# Patient Record
Sex: Male | Born: 2000 | Race: Black or African American | Hispanic: No | Marital: Single | Smoking: Current every day smoker
Health system: Southern US, Community
[De-identification: ages and names within clinical notes are randomized; demographics above are authoritative.]

## PROBLEM LIST (undated history)

## (undated) DIAGNOSIS — L709 Acne, unspecified: Secondary | ICD-10-CM

## (undated) DIAGNOSIS — T7840XA Allergy, unspecified, initial encounter: Secondary | ICD-10-CM

## (undated) DIAGNOSIS — L309 Dermatitis, unspecified: Secondary | ICD-10-CM

## (undated) DIAGNOSIS — S8290XA Unspecified fracture of unspecified lower leg, initial encounter for closed fracture: Secondary | ICD-10-CM

## (undated) HISTORY — DX: Allergy, unspecified, initial encounter: T78.40XA

---
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
## 2019-01-13 ENCOUNTER — Encounter: Payer: Self-pay | Admitting: Emergency Medicine

## 2019-01-13 ENCOUNTER — Other Ambulatory Visit: Payer: Self-pay

## 2019-01-13 ENCOUNTER — Emergency Department
Admission: EM | Admit: 2019-01-13 | Discharge: 2019-01-13 | Disposition: A | Discharge status: Home / Self Care | Payer: Self-pay | Attending: Emergency Medicine | Admitting: Emergency Medicine

## 2019-01-13 DIAGNOSIS — J029 Acute pharyngitis, unspecified: Secondary | ICD-10-CM | POA: Insufficient documentation

## 2019-01-13 DIAGNOSIS — J020 Streptococcal pharyngitis: Secondary | ICD-10-CM

## 2019-01-13 DIAGNOSIS — Z91018 Allergy to other foods: Secondary | ICD-10-CM | POA: Insufficient documentation

## 2019-01-13 DIAGNOSIS — R509 Fever, unspecified: Secondary | ICD-10-CM | POA: Insufficient documentation

## 2019-01-13 MED ORDER — IBUPROFEN 100 MG/5ML PO SUSP
600.0000 mg | Freq: Once | ORAL | Status: AC
  Administered 2019-01-13: 600 mg via ORAL
  Filled 2019-01-13: qty 30

## 2019-01-13 MED ORDER — PENICILLIN G BENZATHINE 1200000 UNIT/2ML IM SUSP
1.2000 10*6.[IU] | Freq: Once | INTRAMUSCULAR | Status: AC
  Administered 2019-01-13: 08:00:00 1.2 10*6.[IU] via INTRAMUSCULAR
  Filled 2019-01-13: qty 2

## 2019-01-13 NOTE — ED Provider Notes (Signed)
[facility] Emergency Department Provider Note ____________________________________________  Time seen: 0745  I have reviewed the triage vital signs and the nursing notes.  HISTORY  Chief Complaint  Sore Throat  HPI Garrett Cook is a 18 y.o. male presents himself to the ED for 2-day complaint of sore throat.  Patient reports pain with swallowing, but has been able to control his secretions.  He was unaware of any intermittent fevers.  He did take a dose of DayQuil prior to arrival.   Past Medical History:  Diagnosis Date  . Acne   . Eczema   . Leg fracture     Patient Active Problem List   Diagnosis Date Noted  . Seasonal allergic rhinitis 11/02/2014  . History of concussion 11/02/2014  . 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 Acne 11/02/2014    History reviewed. No pertinent surgical history.  Prior to Admission medications   Medication Sig Start Date End Date Taking? Authorizing Provider  acetaminophen (TYLENOL) 325 MG tablet Take 650 mg by mouth every 6 (six) hours as needed. Reported on 10/04/2015    [provider]  clindamycin-benzoyl peroxide (BENZACLIN) gel Apply topically daily. In the morning 08/21/16   Rice, Kathlyn Sacramento, MD  diphenhydrAMINE (BENADRYL) 25 MG tablet Take 25 mg by mouth every 6 (six) hours as needed. Reported on 10/04/2015    [provider]  tretinoin (RETIN-A) 0.1 % cream Apply topically at bedtime. 08/21/16   Rice, Kathlyn Sacramento, MD    Allergies Banana and Pollen extract  Family History  Problem Relation Age of Onset  . Hypertension Father   . Cancer Other   . Hypertension Other     Social History Social History   Tobacco Use  . Smoking status: Never Smoker  . Smokeless tobacco: Never Used  Substance Use Topics  . Alcohol use: No  . Drug use: No    Review of Systems  Constitutional: Negative  for fever. Eyes: Negative for visual changes. ENT: Positive for sore throat. Cardiovascular: Negative for chest pain. Respiratory: Negative for shortness of breath. Gastrointestinal: Negative for abdominal pain, vomiting and diarrhea. Genitourinary: Negative for dysuria. Musculoskeletal: Negative for back pain. Skin: Negative for rash. Neurological: Negative for headaches, focal weakness or numbness. ____________________________________________  PHYSICAL EXAM:  VITAL SIGNS: ED Triage Vitals  Enc Vitals Group     BP 01/13/19 0739 127/78     Pulse Rate 01/13/19 0748 (!) 110     Resp 01/13/19 0739 20     Temp 01/13/19 0739 (!) 102 F (38.9 C)     Temp Source 01/13/19 0739 Oral     SpO2 --      Weight 01/13/19 0739 160 lb (72.6 kg)     Height 01/13/19 0739 5\' 8"  (1.727 m)     Head Circumference --      Peak Flow --      Pain Score 01/13/19 0739 7     Pain Loc --      Pain Edu? --      Excl. in [place]? --     Constitutional: Alert and oriented. Well appearing and in no distress. Head: Normocephalic and atraumatic. Eyes: Conjunctivae are normal. PERRL. Normal extraocular movements Mouth/Throat: Mucous membranes are moist.  Uvula is midline and tonsils are not enlarged, erythematous, and exudates noted bilaterally. Neck: Supple. No thyromegaly. Hematological/Lymphatic/Immunological: Palpable bilateral anterior cervical lymphadenopathy. Cardiovascular: Normal rate, regular  rhythm. Normal distal pulses. Respiratory: Normal respiratory effort. No wheezes/rales/rhonchi. Gastrointestinal: Soft and nontender. No distention. Musculoskeletal: Nontender with normal range of motion in all extremities.  Neurologic:  Normal gait without ataxia. Normal speech and language. No gross focal neurologic deficits are appreciated. ____________________________________________  PROCEDURES  Penicillin G benzathine 1.2 million units IM IBU Suspension 600 mg (30 ml) PO   Procedures ____________________________________________  INITIAL IMPRESSION / ASSESSMENT AND PLAN / ED COURSE  Patient with ED evaluation of a 2-day complaint of sore throat.  Patient presents febrile and tachycardic.  His clinical picture is consistent with an acute strep pharyngitis.  He will be treated empirically with pen G given single dose.  Patient is also given ibuprofen suspension for fever management.  He is advised to continue to monitor and treat fevers at home.  He will hydrate to prevent dehydration, and change toothbrush in 24 hours.  Patient is also given strict return precautions should he have sudden worsening of his pain, swelling, or inability to control oral secretions.  Garrett Cook was evaluated in Emergency Department on 01/13/2019 for the symptoms described in the history of present illness. He was evaluated in the context of the global COVID-19 pandemic, which necessitated consideration that the patient might be at risk for infection with the SARS-CoV-2 virus that causes COVID-19. Institutional protocols and algorithms that pertain to the evaluation of patients at risk for COVID-19 are in a state of rapid change based on information released by regulatory bodies including the CDC and federal and state organizations. These policies and algorithms were followed during the patient's care in the ED. ____________________________________________  FINAL CLINICAL IMPRESSION(S) / ED DIAGNOSES  Final diagnoses:  Strep throat      Hilmar Moldovan, Dannielle Karvonen, PA-C 01/13/19 9924    Duffy Bruce, MD 01/13/19 2012

## 2019-01-13 NOTE — ED Triage Notes (Signed)
Pt with fever in triage. Pt was not aware that he had a fever.

## 2019-01-13 NOTE — Discharge Instructions (Signed)
You have been treated for strep throat with a single dose of antibiotic in the ED. You should continue to treat any fevers with Tylenol and Motrin. Drink fluids to prevent dehydration. Change your toothbrush in 24-hours. Return to the ED for sudden swelling or inability to breathe or swallow your spit.

## 2019-01-13 NOTE — ED Triage Notes (Signed)
Pt reports sore throat and swollen tonsils for the last couple of days. Pt denies fevers or recent exposures.

## 2019-05-27 ENCOUNTER — Other Ambulatory Visit: Payer: Self-pay

## 2019-05-27 ENCOUNTER — Encounter: Payer: Self-pay | Admitting: Emergency Medicine

## 2019-05-27 ENCOUNTER — Emergency Department
Admission: EM | Admit: 2019-05-27 | Discharge: 2019-05-27 | Disposition: A | Discharge status: Home / Self Care | Payer: Medicaid Other | Attending: Emergency Medicine | Admitting: Emergency Medicine

## 2019-05-27 DIAGNOSIS — J039 Acute tonsillitis, unspecified: Secondary | ICD-10-CM | POA: Insufficient documentation

## 2019-05-27 LAB — GROUP A STREP BY PCR: Group A Strep by PCR: NOT DETECTED

## 2019-05-27 MED ORDER — AMOXICILLIN-POT CLAVULANATE 875-125 MG PO TABS
1.0000 | ORAL_TABLET | Freq: Once | ORAL | Status: AC
  Administered 2019-05-27: 1 via ORAL
  Filled 2019-05-27: qty 1

## 2019-05-27 MED ORDER — AMOXICILLIN-POT CLAVULANATE 875-125 MG PO TABS: 1.0000 | ORAL_TABLET | Freq: Two times a day (BID) | ORAL | 0 refills | Status: AC

## 2019-05-27 MED ORDER — LIDOCAINE VISCOUS HCL 2 % MT SOLN
15.0000 mL | Freq: Once | OROMUCOSAL | Status: AC
  Administered 2019-05-27: 15 mL via OROMUCOSAL
  Filled 2019-05-27: qty 15

## 2019-05-27 NOTE — ED Provider Notes (Signed)
[facility] Emergency Department Provider Note  ____________________________________________  Time seen: Approximately 11:13 AM  I have reviewed the triage vital signs and the nursing notes.   HISTORY  Chief Complaint Sore Throat    HPI Garrett Cook is a 19 y.o. male that presents to emergency department for evaluation of sore throat and body aches for 1 day.  Patient has a history of strep throat and states this feels the same.  He is sneezing a bit.  No known fevers. No contacts with Covid 19.  No cough, shortness of breath, vomiting, abdominal pain.   Past Medical History:  Diagnosis Date  . Acne   . Eczema   . Leg fracture     Patient Active Problem List   Diagnosis Date Noted  . Seasonal allergic rhinitis 11/02/2014  . History of concussion 11/02/2014  . 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 Acne 11/02/2014    History reviewed. No pertinent surgical history.  Prior to Admission medications   Medication Sig Start Date End Date Taking? Authorizing Provider  acetaminophen (TYLENOL) 325 MG tablet Take 650 mg by mouth every 6 (six) hours as needed. Reported on 10/04/2015    [provider]  amoxicillin-clavulanate (AUGMENTIN) 875-125 MG tablet Take 1 tablet by mouth 2 (two) times daily for 10 days. 05/27/19 06/06/19  Laban Emperor, PA-C  clindamycin-benzoyl peroxide (BENZACLIN) gel Apply topically daily. In the morning 08/21/16   Rice, Trenton Gammon, MD  diphenhydrAMINE (BENADRYL) 25 MG tablet Take 25 mg by mouth every 6 (six) hours as needed. Reported on 10/04/2015    [provider]  tretinoin (RETIN-A) 0.1 % cream Apply topically at bedtime. 08/21/16   Rice, Trenton Gammon, MD    Allergies Banana and Pollen extract  Family History  Problem Relation Age of Onset  . Hypertension Father   . Cancer Other   . Hypertension Other      Social History Social History   Tobacco Use  . Smoking status: Never Smoker  . Smokeless tobacco: Never Used  Substance Use Topics  . Alcohol use: No  . Drug use: No     Review of Systems  Constitutional: No fever/chills Eyes: No visual changes. No discharge. ENT: Negative for congestion. Positive for rhinorrhea. Positive for sore throat. Cardiovascular: No chest pain. Respiratory: Negative for cough. No SOB. Gastrointestinal: No abdominal pain.  No nausea, no vomiting.  No diarrhea.  No constipation. Musculoskeletal: Negative for musculoskeletal pain. Skin: Negative for rash, abrasions, lacerations, ecchymosis. Neurological: Negative for headaches.   ____________________________________________   PHYSICAL EXAM:  VITAL SIGNS: ED Triage Vitals  Enc Vitals Group     BP 05/27/19 1103 117/69     Pulse Rate 05/27/19 1103 85     Resp 05/27/19 1103 18     Temp 05/27/19 1103 99.3 F (37.4 C)     Temp Source 05/27/19 1103 Oral     SpO2 05/27/19 1103 99 %     Weight 05/27/19 1056 160 lb (72.6 kg)     Height 05/27/19 1056 5\' 8"  (1.727 m)     Head Circumference --      Peak Flow --      Pain Score 05/27/19 1056 8     Pain Loc --      Pain Edu? --      Excl. in [place]? --      Constitutional: Alert and oriented.  Well appearing and in no acute distress. Eyes: Conjunctivae are normal. PERRL. EOMI. No discharge. Head: Atraumatic. ENT: No frontal and maxillary sinus tenderness.      Ears: Tympanic membranes pearly gray with good landmarks. No discharge.      Nose: Mild rhinnorhea.      Mouth/Throat: Mucous membranes are moist. Oropharynx erythematous. Tonsils enlarged 1+bilaterally with exudates. Uvula midline. Neck: No stridor.   Hematological/Lymphatic/Immunilogical: No cervical lymphadenopathy. Cardiovascular: Normal rate, regular rhythm.  Good peripheral circulation. Respiratory: Normal respiratory effort without tachypnea or retractions. Lungs CTAB. Good air entry to  the bases with no decreased or absent breath sounds. Gastrointestinal: Bowel sounds 4 quadrants. Soft and nontender to palpation. No guarding or rigidity. No palpable masses. No distention. Musculoskeletal: Full range of motion to all extremities. No gross deformities appreciated. Neurologic:  Normal speech and language. No gross focal neurologic deficits are appreciated.  Skin:  Skin is warm, dry and intact. No rash noted. Psychiatric: Mood and affect are normal. Speech and behavior are normal. Patient exhibits appropriate insight and judgement.   ____________________________________________   LABS (all labs ordered are listed, but only abnormal results are displayed)  Labs Reviewed  GROUP A STREP BY PCR   ____________________________________________  EKG   ____________________________________________  RADIOLOGY  No results found.  ____________________________________________    PROCEDURES  Procedure(s) performed:    Procedures    Medications  amoxicillin-clavulanate (AUGMENTIN) 875-125 MG per tablet 1 tablet (1 tablet Oral Given 05/27/19 1132)  lidocaine (XYLOCAINE) 2 % viscous mouth solution 15 mL (15 mLs Mouth/Throat Given 05/27/19 1133)     ____________________________________________   INITIAL IMPRESSION / ASSESSMENT AND PLAN / ED COURSE  Pertinent labs & imaging results that were available during my care of the patient were reviewed by me and considered in my medical decision making (see chart for details).  Review of the [place] CSRS was performed in accordance of the [facility] prior to dispensing any controlled drugs.   Patient's diagnosis is consistent with tonsillitis. Vital signs and exam are reassuring. Strep is negative. Patient appears well and is staying well hydrated. Patient feels comfortable going home. Patient will be discharged home with prescriptions for Augmentin. Patient is to follow up with PCP as needed or otherwise directed. Patient is given ED  precautions to return to the ED for any worsening or new symptoms.   Garrett Cook was evaluated in Emergency Department on 05/27/2019 for the symptoms described in the history of present illness. He was evaluated in the context of the global COVID-19 pandemic, which necessitated consideration that the patient might be at risk for infection with the SARS-CoV-2 virus that causes COVID-19. Institutional protocols and algorithms that pertain to the evaluation of patients at risk for COVID-19 are in a state of rapid change based on information released by regulatory bodies including the CDC and federal and state organizations. These policies and algorithms were followed during the patient's care in the ED.  ____________________________________________  FINAL CLINICAL IMPRESSION(S) / ED DIAGNOSES  Final diagnoses:  Tonsillitis      NEW MEDICATIONS STARTED DURING THIS VISIT:  ED Discharge Orders         Ordered    amoxicillin-clavulanate (AUGMENTIN) 875-125 MG tablet  2 times daily     05/27/19 1126              This chart was dictated using voice recognition software/Dragon. Despite best efforts to proofread, errors can occur which can change the meaning. Any change was purely unintentional.  Enid Derry, PA-C 05/27/19 1535    Minna Antis, MD 05/28/19 1317

## 2019-05-27 NOTE — ED Triage Notes (Signed)
Patient presents to the ED for sore throat since yesterday.  Patient states he noticed white patches on his throat.  Patient is in no obvious distress at this time.

## 2022-01-29 ENCOUNTER — Encounter (HOSPITAL_COMMUNITY): Payer: Self-pay

## 2022-01-29 ENCOUNTER — Other Ambulatory Visit: Payer: Self-pay

## 2022-01-29 ENCOUNTER — Inpatient Hospital Stay (HOSPITAL_COMMUNITY)
Admission: EM | Admit: 2022-01-29 | Discharge: 2022-01-31 | DRG: 391 | Disposition: A | Discharge status: Home / Self Care | Payer: Self-pay | Attending: Internal Medicine | Admitting: Internal Medicine

## 2022-01-29 DIAGNOSIS — R6511 Systemic inflammatory response syndrome (SIRS) of non-infectious origin with acute organ dysfunction: Secondary | ICD-10-CM | POA: Diagnosis present

## 2022-01-29 DIAGNOSIS — K529 Noninfective gastroenteritis and colitis, unspecified: Secondary | ICD-10-CM | POA: Diagnosis present

## 2022-01-29 DIAGNOSIS — J302 Other seasonal allergic rhinitis: Secondary | ICD-10-CM | POA: Diagnosis present

## 2022-01-29 DIAGNOSIS — I959 Hypotension, unspecified: Secondary | ICD-10-CM | POA: Diagnosis present

## 2022-01-29 DIAGNOSIS — A419 Sepsis, unspecified organism: Principal | ICD-10-CM

## 2022-01-29 DIAGNOSIS — N179 Acute kidney failure, unspecified: Secondary | ICD-10-CM | POA: Diagnosis present

## 2022-01-29 DIAGNOSIS — Z20822 Contact with and (suspected) exposure to covid-19: Secondary | ICD-10-CM | POA: Diagnosis present

## 2022-01-29 DIAGNOSIS — Z8719 Personal history of other diseases of the digestive system: Secondary | ICD-10-CM | POA: Diagnosis present

## 2022-01-29 DIAGNOSIS — R112 Nausea with vomiting, unspecified: Secondary | ICD-10-CM

## 2022-01-29 DIAGNOSIS — A059 Bacterial foodborne intoxication, unspecified: Principal | ICD-10-CM | POA: Diagnosis present

## 2022-01-29 DIAGNOSIS — Z91018 Allergy to other foods: Secondary | ICD-10-CM

## 2022-01-29 DIAGNOSIS — E861 Hypovolemia: Secondary | ICD-10-CM | POA: Diagnosis present

## 2022-01-29 MED ORDER — ONDANSETRON HCL 4 MG/2ML IJ SOLN
4.0000 mg | Freq: Once | INTRAMUSCULAR | Status: AC
  Administered 2022-01-29: 4 mg via INTRAVENOUS
  Filled 2022-01-29: qty 2

## 2022-01-29 MED ORDER — SODIUM CHLORIDE 0.9 % IV BOLUS
1000.0000 mL | Freq: Once | INTRAVENOUS | Status: AC
  Administered 2022-01-29: 1000 mL via INTRAVENOUS

## 2022-01-29 NOTE — ED Provider Triage Note (Signed)
Emergency Medicine Provider Triage Evaluation Note  Garrett Cook , a 21 y.o. male  was evaluated in triage.  Pt complains of N/V/D that started a few hours after having [place] chicken dip from [employer] 2 days ago. Patient notes lower abdominal pain/cramping, subjective fever, chills, and generalized weakness. No prior abdominal surgeries.  Review of Systems  Positive: As above Negative: As above  Physical Exam  BP (!) 86/58 (BP Location: Right Arm)   Pulse (!) 152   Temp 99.9 F (37.7 C) (Oral)   Resp 18   Ht 5\' 8"  (1.727 m)   Wt 68 kg   SpO2 96%   BMI 22.81 kg/m  Gen:   Awake, ill appearing, no distress   Resp:  Normal effort  MSK:   Moves extremities without difficulty  Other:  No focal abdominal TTP. Heart rate tachycardic in the 140-150's.  Medical Decision Making  Medically screening exam initiated at 11:15 PM.  Appropriate orders placed.  Garrett Cook was informed that the remainder of the evaluation will be completed by another provider, this initial triage assessment does not replace that evaluation, and the importance of remaining in the ED until their evaluation is complete.  N/V/D x 2 days - hypotensive and tachycardia likely 2/2 hypovolemia. Labs ordered as well as blood cultures, lactate, abdominopelvic CT. Charge RN alerted of need for expedited room placement given hypotension.   [name], PA-C 01/29/22 2321

## 2022-01-29 NOTE — ED Triage Notes (Signed)
Pt c/o nausea, vomiting, diarrhea, chills and weakness x 2 days.  Pt has had fevers.

## 2022-01-30 ENCOUNTER — Encounter (HOSPITAL_COMMUNITY): Payer: Self-pay | Admitting: Internal Medicine

## 2022-01-30 ENCOUNTER — Observation Stay (HOSPITAL_COMMUNITY): Payer: Self-pay

## 2022-01-30 ENCOUNTER — Emergency Department (HOSPITAL_COMMUNITY): Payer: Self-pay

## 2022-01-30 DIAGNOSIS — Z8719 Personal history of other diseases of the digestive system: Secondary | ICD-10-CM | POA: Diagnosis present

## 2022-01-30 DIAGNOSIS — R6511 Systemic inflammatory response syndrome (SIRS) of non-infectious origin with acute organ dysfunction: Secondary | ICD-10-CM | POA: Diagnosis present

## 2022-01-30 DIAGNOSIS — K529 Noninfective gastroenteritis and colitis, unspecified: Secondary | ICD-10-CM | POA: Diagnosis present

## 2022-01-30 DIAGNOSIS — A059 Bacterial foodborne intoxication, unspecified: Secondary | ICD-10-CM | POA: Diagnosis present

## 2022-01-30 DIAGNOSIS — N179 Acute kidney failure, unspecified: Secondary | ICD-10-CM | POA: Diagnosis present

## 2022-01-30 LAB — URINALYSIS, ROUTINE W REFLEX MICROSCOPIC
Bacteria, UA: NONE SEEN
Bilirubin Urine: NEGATIVE
Glucose, UA: NEGATIVE mg/dL
Ketones, ur: NEGATIVE mg/dL
Leukocytes,Ua: NEGATIVE
Nitrite: NEGATIVE
Protein, ur: NEGATIVE mg/dL
Specific Gravity, Urine: 1.005 (ref 1.005–1.030)
pH: 6 (ref 5.0–8.0)

## 2022-01-30 LAB — COMPREHENSIVE METABOLIC PANEL
ALT: 23 U/L (ref 0–44)
ALT: 26 U/L (ref 0–44)
ALT: 37 U/L (ref 0–44)
ALT: 8 U/L (ref 0–44)
AST: 11 U/L — ABNORMAL LOW (ref 15–41)
AST: 31 U/L (ref 15–41)
AST: 32 U/L (ref 15–41)
AST: 41 U/L (ref 15–41)
Albumin: 2.9 g/dL — ABNORMAL LOW (ref 3.5–5.0)
Albumin: 2.9 g/dL — ABNORMAL LOW (ref 3.5–5.0)
Albumin: 4.2 g/dL (ref 3.5–5.0)
Albumin: <1.5 g/dL — ABNORMAL LOW (ref 3.5–5.0)
Alkaline Phosphatase: 34 U/L — ABNORMAL LOW (ref 38–126)
Alkaline Phosphatase: 36 U/L — ABNORMAL LOW (ref 38–126)
Alkaline Phosphatase: 50 U/L (ref 38–126)
Alkaline Phosphatase: 9 U/L — ABNORMAL LOW (ref 38–126)
Anion gap: 13 (ref 5–15)
Anion gap: 13 (ref 5–15)
Anion gap: 15 (ref 5–15)
Anion gap: 8 (ref 5–15)
BUN: 17 mg/dL (ref 6–20)
BUN: 23 mg/dL — ABNORMAL HIGH (ref 6–20)
BUN: 23 mg/dL — ABNORMAL HIGH (ref 6–20)
BUN: 9 mg/dL (ref 6–20)
CO2: 10 mmol/L — ABNORMAL LOW (ref 22–32)
CO2: 24 mmol/L (ref 22–32)
CO2: 25 mmol/L (ref 22–32)
CO2: 25 mmol/L (ref 22–32)
Calcium: 6.4 mg/dL — CL (ref 8.9–10.3)
Calcium: 8.1 mg/dL — ABNORMAL LOW (ref 8.9–10.3)
Calcium: 8.3 mg/dL — ABNORMAL LOW (ref 8.9–10.3)
Calcium: 9.3 mg/dL (ref 8.9–10.3)
Chloride: 104 mmol/L (ref 98–111)
Chloride: 106 mmol/L (ref 98–111)
Chloride: 95 mmol/L — ABNORMAL LOW (ref 98–111)
Chloride: 99 mmol/L (ref 98–111)
Creatinine, Ser: 0.73 mg/dL (ref 0.61–1.24)
Creatinine, Ser: 2.11 mg/dL — ABNORMAL HIGH (ref 0.61–1.24)
Creatinine, Ser: 2.27 mg/dL — ABNORMAL HIGH (ref 0.61–1.24)
Creatinine, Ser: 2.57 mg/dL — ABNORMAL HIGH (ref 0.61–1.24)
GFR, Estimated: 35 mL/min — ABNORMAL LOW (ref >60)
GFR, Estimated: 41 mL/min — ABNORMAL LOW (ref >60)
GFR, Estimated: 44 mL/min — ABNORMAL LOW (ref >60)
GFR, Estimated: >60 mL/min (ref >60)
Glucose, Bld: 113 mg/dL — ABNORMAL HIGH (ref 70–99)
Glucose, Bld: 136 mg/dL — ABNORMAL HIGH (ref 70–99)
Glucose, Bld: 150 mg/dL — ABNORMAL HIGH (ref 70–99)
Glucose, Bld: 57 mg/dL — ABNORMAL LOW (ref 70–99)
Potassium: 3.8 mmol/L (ref 3.5–5.1)
Potassium: 3.9 mmol/L (ref 3.5–5.1)
Potassium: 4 mmol/L (ref 3.5–5.1)
Potassium: 4.1 mmol/L (ref 3.5–5.1)
Sodium: 131 mmol/L — ABNORMAL LOW (ref 135–145)
Sodium: 133 mmol/L — ABNORMAL LOW (ref 135–145)
Sodium: 136 mmol/L (ref 135–145)
Sodium: 137 mmol/L (ref 135–145)
Total Bilirubin: 0.4 mg/dL (ref 0.3–1.2)
Total Bilirubin: 0.5 mg/dL (ref 0.3–1.2)
Total Bilirubin: 0.8 mg/dL (ref 0.3–1.2)
Total Bilirubin: 1.3 mg/dL — ABNORMAL HIGH (ref 0.3–1.2)
Total Protein: 5.4 g/dL — ABNORMAL LOW (ref 6.5–8.1)
Total Protein: 5.5 g/dL — ABNORMAL LOW (ref 6.5–8.1)
Total Protein: 8.1 g/dL (ref 6.5–8.1)
Total Protein: <3 g/dL — ABNORMAL LOW (ref 6.5–8.1)

## 2022-01-30 LAB — CBC WITH DIFFERENTIAL/PLATELET
Abs Immature Granulocytes: 0.12 K/uL — ABNORMAL HIGH (ref 0.00–0.07)
Abs Immature Granulocytes: 0.07 10*3/uL (ref 0.00–0.07)
Basophils Absolute: 0 K/uL (ref 0.0–0.1)
Basophils Absolute: 0 10*3/uL (ref 0.0–0.1)
Basophils Relative: 0 %
Basophils Relative: 0 %
Eosinophils Absolute: 0 K/uL (ref 0.0–0.5)
Eosinophils Absolute: 0 10*3/uL (ref 0.0–0.5)
Eosinophils Relative: 0 %
Eosinophils Relative: 0 %
HCT: 46.8 % (ref 39.0–52.0)
HCT: 34.6 % — ABNORMAL LOW (ref 39.0–52.0)
Hemoglobin: 15.9 g/dL (ref 13.0–17.0)
Hemoglobin: 11.6 g/dL — ABNORMAL LOW (ref 13.0–17.0)
Immature Granulocytes: 1 %
Immature Granulocytes: 1 %
Lymphocytes Relative: 11 %
Lymphocytes Relative: 13 %
Lymphs Abs: 1 K/uL (ref 0.7–4.0)
Lymphs Abs: 1 10*3/uL (ref 0.7–4.0)
MCH: 28.6 pg (ref 26.0–34.0)
MCH: 28.6 pg (ref 26.0–34.0)
MCHC: 34 g/dL (ref 30.0–36.0)
MCHC: 33.5 g/dL (ref 30.0–36.0)
MCV: 84.2 fL (ref 80.0–100.0)
MCV: 85.2 fL (ref 80.0–100.0)
Monocytes Absolute: 0.4 K/uL (ref 0.1–1.0)
Monocytes Absolute: 0.5 10*3/uL (ref 0.1–1.0)
Monocytes Relative: 4 %
Monocytes Relative: 6 %
Neutro Abs: 7.9 K/uL — ABNORMAL HIGH (ref 1.7–7.7)
Neutro Abs: 6.4 10*3/uL (ref 1.7–7.7)
Neutrophils Relative %: 84 %
Neutrophils Relative %: 80 %
Platelets: 272 K/uL (ref 150–400)
Platelets: 202 10*3/uL (ref 150–400)
RBC: 5.56 MIL/uL (ref 4.22–5.81)
RBC: 4.06 MIL/uL — ABNORMAL LOW (ref 4.22–5.81)
RDW: 12.9 % (ref 11.5–15.5)
RDW: 12.8 % (ref 11.5–15.5)
WBC: 9.4 K/uL (ref 4.0–10.5)
WBC: 8 10*3/uL (ref 4.0–10.5)
nRBC: 0 % (ref 0.0–0.2)
nRBC: 0 % (ref 0.0–0.2)

## 2022-01-30 LAB — PROTIME-INR
INR: 1.1 (ref 0.8–1.2)
Prothrombin Time: 14.5 seconds (ref 11.4–15.2)

## 2022-01-30 LAB — MAGNESIUM: Magnesium: 1.5 mg/dL — ABNORMAL LOW (ref 1.7–2.4)

## 2022-01-30 LAB — RESP PANEL BY RT-PCR (FLU A&B, COVID) ARPGX2
Influenza A by PCR: NEGATIVE
Influenza B by PCR: NEGATIVE
SARS Coronavirus 2 by RT PCR: NEGATIVE

## 2022-01-30 LAB — LACTIC ACID, PLASMA
Lactic Acid, Venous: 1.6 mmol/L (ref 0.5–1.9)
Lactic Acid, Venous: 2.6 mmol/L (ref 0.5–1.9)

## 2022-01-30 LAB — CBG MONITORING, ED: Glucose-Capillary: 132 mg/dL — ABNORMAL HIGH (ref 70–99)

## 2022-01-30 LAB — APTT: aPTT: 33 seconds (ref 24–36)

## 2022-01-30 LAB — CK: Total CK: 134 U/L (ref 49–397)

## 2022-01-30 LAB — LIPASE, BLOOD: Lipase: 32 U/L (ref 11–51)

## 2022-01-30 MED ORDER — HEPARIN SODIUM (PORCINE) 5000 UNIT/ML IJ SOLN
5000.0000 [IU] | Freq: Three times a day (TID) | INTRAMUSCULAR | Status: DC
  Filled 2022-01-30 (×3): qty 1

## 2022-01-30 MED ORDER — LACTATED RINGERS IV BOLUS
1000.0000 mL | INTRAVENOUS | Status: AC
  Administered 2022-01-30 (×2): 1000 mL via INTRAVENOUS

## 2022-01-30 MED ORDER — LACTATED RINGERS IV BOLUS (SEPSIS)
1000.0000 mL | Freq: Once | INTRAVENOUS | Status: AC
  Administered 2022-01-30: 1000 mL via INTRAVENOUS

## 2022-01-30 MED ORDER — LACTATED RINGERS IV BOLUS (SEPSIS)
250.0000 mL | Freq: Once | INTRAVENOUS | Status: AC
  Administered 2022-01-30: 250 mL via INTRAVENOUS

## 2022-01-30 MED ORDER — LOPERAMIDE HCL 2 MG PO CAPS: 2.0000 mg | ORAL_CAPSULE | Freq: Three times a day (TID) | ORAL | Status: DC | PRN

## 2022-01-30 MED ORDER — LACTATED RINGERS IV SOLN: INTRAVENOUS | Status: AC

## 2022-01-30 MED ORDER — PROCHLORPERAZINE EDISYLATE 10 MG/2ML IJ SOLN
10.0000 mg | INTRAMUSCULAR | Status: DC | PRN
  Administered 2022-01-30: 10 mg via INTRAVENOUS
  Filled 2022-01-30: qty 2

## 2022-01-30 MED ORDER — ACETAMINOPHEN 325 MG PO TABS: 650.0000 mg | ORAL_TABLET | Freq: Four times a day (QID) | ORAL | Status: DC | PRN

## 2022-01-30 MED ORDER — ACETAMINOPHEN 650 MG RE SUPP: 650.0000 mg | Freq: Four times a day (QID) | RECTAL | Status: DC | PRN

## 2022-01-30 MED ORDER — DICYCLOMINE HCL 10 MG/ML IM SOLN
20.0000 mg | Freq: Once | INTRAMUSCULAR | Status: AC
  Administered 2022-01-30: 20 mg via INTRAMUSCULAR
  Filled 2022-01-30: qty 2

## 2022-01-30 MED ORDER — SODIUM CHLORIDE 0.9 % IV SOLN
2.0000 g | Freq: Once | INTRAVENOUS | Status: AC
  Administered 2022-01-30: 2 g via INTRAVENOUS
  Filled 2022-01-30: qty 12.5

## 2022-01-30 MED ORDER — LACTATED RINGERS IV SOLN: INTRAVENOUS | Status: DC

## 2022-01-30 MED ORDER — DROPERIDOL 2.5 MG/ML IJ SOLN
1.2500 mg | Freq: Once | INTRAMUSCULAR | Status: AC
  Administered 2022-01-30: 1.25 mg via INTRAVENOUS
  Filled 2022-01-30: qty 2

## 2022-01-30 MED ORDER — MAGNESIUM SULFATE 4 GM/100ML IV SOLN
4.0000 g | Freq: Once | INTRAVENOUS | Status: AC
  Administered 2022-01-30: 4 g via INTRAVENOUS
  Filled 2022-01-30 (×2): qty 100

## 2022-01-30 MED ORDER — VANCOMYCIN HCL IN DEXTROSE 1-5 GM/200ML-% IV SOLN
1000.0000 mg | Freq: Once | INTRAVENOUS | Status: AC
  Administered 2022-01-30: 1000 mg via INTRAVENOUS
  Filled 2022-01-30: qty 200

## 2022-01-30 MED ORDER — METRONIDAZOLE 500 MG/100ML IV SOLN
500.0000 mg | Freq: Once | INTRAVENOUS | Status: AC
  Administered 2022-01-30: 500 mg via INTRAVENOUS
  Filled 2022-01-30: qty 100

## 2022-01-30 NOTE — ED Notes (Signed)
Admitting provider at bedside.

## 2022-01-30 NOTE — ED Notes (Signed)
Patient transported to CT 

## 2022-01-30 NOTE — ED Provider Notes (Signed)
[facility] EMERGENCY DEPARTMENT Provider Note   CSN: [id number] Arrival date & time: 01/29/22  2107     History  Chief Complaint  Patient presents with   Emesis    Garrett Cook is a 21 y.o. male.  The history is provided by the patient.  Emesis Severity:  Severe Duration:  3 days Timing:  Intermittent Quality:  Stomach contents Progression:  Unchanged Chronicity:  New Context: not post-tussive   Relieved by:  Nothing Worsened by:  Nothing Ineffective treatments:  None tried Associated symptoms: diarrhea and fever   Risk factors: suspect food intake        Home Medications Prior to Admission medications   Not on File      Allergies    Banana and Pollen extract    Review of Systems   Review of Systems  Constitutional:  Positive for fever.  HENT:  Negative for facial swelling.   Respiratory:  Negative for wheezing and stridor.   Gastrointestinal:  Positive for diarrhea, nausea and vomiting.  All other systems reviewed and are negative. Ate [employer] [place] Dip   Physical Exam Updated Vital Signs BP (!) 105/59   Pulse (!) 108   Temp 99.9 F (37.7 C) (Oral)   Resp 17   Ht 5\' 8"  (1.727 m)   Wt 68 kg   SpO2 98%   BMI 22.81 kg/m  Physical Exam Constitutional:      General: He is not in acute distress.    Appearance: Normal appearance. He is well-developed. He is not diaphoretic.  HENT:     Head: Normocephalic and atraumatic.     Nose: Nose normal.  Eyes:     Conjunctiva/sclera: Conjunctivae normal.     Pupils: Pupils are equal, round, and reactive to light.  Cardiovascular:     Rate and Rhythm: Regular rhythm. Tachycardia present.  Pulmonary:     Effort: Pulmonary effort is normal.     Breath sounds: Normal breath sounds. No wheezing or rales.  Abdominal:     General: Bowel sounds are normal.     Palpations: Abdomen is soft.     Tenderness: There is no abdominal tenderness. There is no guarding or rebound.  Musculoskeletal:         General: Normal range of motion.     Cervical back: Normal range of motion and neck supple.  Skin:    General: Skin is warm and dry.     Capillary Refill: Capillary refill takes less than 2 seconds.  Neurological:     General: No focal deficit present.     Mental Status: He is alert and oriented to person, place, and time.  Psychiatric:        Mood and Affect: Mood normal.     ED Results / Procedures / Treatments   Labs (all labs ordered are listed, but only abnormal results are displayed) Results for orders placed or performed during the hospital encounter of 01/29/22  Resp Panel by RT-PCR (Flu A&B, Covid) Anterior Nasal Swab   Specimen: Anterior Nasal Swab  Result Value Ref Range   SARS Coronavirus 2 by RT PCR NEGATIVE NEGATIVE   Influenza A by PCR NEGATIVE NEGATIVE   Influenza B by PCR NEGATIVE NEGATIVE  CBC with Differential  Result Value Ref Range   WBC 9.4 4.0 - 10.5 K/uL   RBC 5.56 4.22 - 5.81 MIL/uL   Hemoglobin 15.9 13.0 - 17.0 g/dL   HCT 01/31/22 22.9 - 79.8 %   MCV 84.2  80.0 - 100.0 fL   MCH 28.6 26.0 - 34.0 pg   MCHC 34.0 30.0 - 36.0 g/dL   RDW 62.2 29.7 - 98.9 %   Platelets 272 150 - 400 K/uL   nRBC 0.0 0.0 - 0.2 %   Neutrophils Relative % 84 %   Neutro Abs 7.9 (H) 1.7 - 7.7 K/uL   Lymphocytes Relative 11 %   Lymphs Abs 1.0 0.7 - 4.0 K/uL   Monocytes Relative 4 %   Monocytes Absolute 0.4 0.1 - 1.0 K/uL   Eosinophils Relative 0 %   Eosinophils Absolute 0.0 0.0 - 0.5 K/uL   Basophils Relative 0 %   Basophils Absolute 0.0 0.0 - 0.1 K/uL   Immature Granulocytes 1 %   Abs Immature Granulocytes 0.12 (H) 0.00 - 0.07 K/uL  Comprehensive metabolic panel  Result Value Ref Range   Sodium 133 (L) 135 - 145 mmol/L   Potassium 3.8 3.5 - 5.1 mmol/L   Chloride 95 (L) 98 - 111 mmol/L   CO2 25 22 - 32 mmol/L   Glucose, Bld 150 (H) 70 - 99 mg/dL   BUN 23 (H) 6 - 20 mg/dL   Creatinine, Ser 2.11 (H) 0.61 - 1.24 mg/dL   Calcium 9.3 8.9 - 94.1 mg/dL   Total Protein  8.1 6.5 - 8.1 g/dL   Albumin 4.2 3.5 - 5.0 g/dL   AST 41 15 - 41 U/L   ALT 37 0 - 44 U/L   Alkaline Phosphatase 50 38 - 126 U/L   Total Bilirubin 1.3 (H) 0.3 - 1.2 mg/dL   GFR, Estimated 35 (L) >60 mL/min   Anion gap 13 5 - 15  Lipase, blood  Result Value Ref Range   Lipase 32 11 - 51 U/L  Lactic acid, plasma  Result Value Ref Range   Lactic Acid, Venous 2.6 (HH) 0.5 - 1.9 mmol/L  Lactic acid, plasma  Result Value Ref Range   Lactic Acid, Venous 1.6 0.5 - 1.9 mmol/L  Protime-INR  Result Value Ref Range   Prothrombin Time 14.5 11.4 - 15.2 seconds   INR 1.1 0.8 - 1.2  APTT  Result Value Ref Range   aPTT 33 24 - 36 seconds   DG Chest Port 1 View  Result Date: 01/30/2022 CLINICAL DATA:  Nausea/vomiting/diarrhea, sepsis EXAM: PORTABLE CHEST 1 VIEW COMPARISON:  None Available. FINDINGS: Lungs are clear.  No pleural effusion or pneumothorax. The heart is normal in size. IMPRESSION: No evidence of acute cardiopulmonary disease. Electronically Signed   By: Charline Bills M.D.   On: 01/30/2022 01:05    EKG  EKG Interpretation  Date/Time:  Tuesday January 30 2022 01:21:03 EST Ventricular Rate:  108 PR Interval:  128 QRS Duration: 90 QT Interval:  281 QTC Calculation: 377 R Axis:   56 Text Interpretation: Sinus tachycardia RSR' in V1 or V2, right VCD or RVH Confirmed by Nicanor Alcon, Brindy Higginbotham ([id number]) on 01/30/2022 2:40:01 AM         Radiology DG Chest Port 1 View  Result Date: 01/30/2022 CLINICAL DATA:  Nausea/vomiting/diarrhea, sepsis EXAM: PORTABLE CHEST 1 VIEW COMPARISON:  None Available. FINDINGS: Lungs are clear.  No pleural effusion or pneumothorax. The heart is normal in size. IMPRESSION: No evidence of acute cardiopulmonary disease. Electronically Signed   By: Charline Bills M.D.   On: 01/30/2022 01:05    Procedures Procedures    Medications Ordered in ED Medications  lactated ringers infusion ( Intravenous New Bag/Given 01/30/22 0131)  sodium chloride 0.9 %  bolus 1,000 mL (1,000 mLs Intravenous New Bag/Given 01/29/22 2347)  ondansetron (ZOFRAN) injection 4 mg (4 mg Intravenous Given 01/29/22 2347)  lactated ringers bolus 1,000 mL (1,000 mLs Intravenous New Bag/Given 01/30/22 0130)    And  lactated ringers bolus 1,000 mL (1,000 mLs Intravenous New Bag/Given 01/30/22 0130)    And  lactated ringers bolus 250 mL (250 mLs Intravenous New Bag/Given 01/30/22 0223)  ceFEPIme (MAXIPIME) 2 g in sodium chloride 0.9 % 100 mL IVPB (2 g Intravenous New Bag/Given 01/30/22 0119)  metroNIDAZOLE (FLAGYL) IVPB 500 mg (500 mg Intravenous New Bag/Given 01/30/22 0125)  vancomycin (VANCOCIN) IVPB 1000 mg/200 mL premix (1,000 mg Intravenous New Bag/Given 01/30/22 0132)  dicyclomine (BENTYL) injection 20 mg (20 mg Intramuscular Given 01/30/22 0223)    ED Course/ Medical Decision Making/ A&P                           Medical Decision Making Patient ate something and then 3 hours later began having   Problems Addressed: AKI (acute kidney injury) [facility]):    Details: IV fluids and admission  Nausea vomiting and diarrhea:    Details: Droperidol and IV fluids   Amount and/or Complexity of Data Reviewed External Data Reviewed: notes.    Details: Previous notes reviewed  Labs: ordered.    Details: All labs reviewed:  normal lactate 1.6.  Normal white count 9.4, hemoglobin 15.9, normal platelets. Negative covid and flu.  Low sodium 133, normal potassium 3.8, elevated BUN 23, elevated creatinine 2.57, normal AST and ALT mild bilirubin 1.3.  Normal coagulation studies   Radiology: ordered and independent interpretation performed.    Details: Normal CXR by me  ECG/medicine tests: ordered and independent interpretation performed. Decision-making details documented in ED Course. Discussion of management or test interpretation with external provider(s): Dr. Imogene Burn triad   Risk Prescription drug management. Decision regarding hospitalization.  Critical Care Total time  providing critical care: 60 minutes (Sepsis bundle )   CRITICAL CARE Performed by: Lymon Kidney K Aime Meloche-Rasch Total critical care time: 60 minutes Critical care time was exclusive of separately billable procedures and treating other patients. Critical care was necessary to treat or prevent imminent or life-threatening deterioration. Critical care was time spent personally by me on the following activities: development of treatment plan with patient and/or surrogate as well as nursing, discussions with consultants, evaluation of patient's response to treatment, examination of patient, obtaining history from patient or surrogate, ordering and performing treatments and interventions, ordering and review of laboratory studies, ordering and review of radiographic studies, pulse oximetry and re-evaluation of patient's condition.  Final Clinical Impression(s) / ED Diagnoses Final diagnoses:  Sepsis, due to unspecified organism, unspecified whether acute organ dysfunction present [facility])  AKI (acute kidney injury) (HCC)  Nausea vomiting and diarrhea    Rx / DC Orders ED Discharge Orders     None         Daeshon Grammatico, MD 01/30/22 5638

## 2022-01-30 NOTE — Assessment & Plan Note (Addendum)
Continue with IVF. Likely due to N/V/D. Do not give immodium or lomotil or other anti-diarrheal meds.

## 2022-01-30 NOTE — ED Notes (Signed)
Patient calls out for RN for ongoing emesis. Patient ambulated to rr, reports having a diarrhea episode as well and tx more emesis. Emesis noted to be color green. Patient denies pain, PRN n/v administered.

## 2022-01-30 NOTE — H&P (Signed)
History and Physical    Garrett Cook MWU:[id number] DOB: 2001-01-22 DOA: 01/29/2022  DOS: the patient was seen and examined on 01/29/2022  PCP: Patient, No Pcp Per   Patient coming from: Home  I have personally briefly reviewed patient's old medical records in  Link  CC: N/V/D HPI: 21 year old African-American male with no significant medical history presents to the ER today with a 3-day history of nausea vomiting diarrhea.  He states that he had some leftover buffalo chicken dip from the restaurant [employer] on Saturday evening.  This was around 10 PM.  By midnight, he was having vomiting and diarrhea.  This continued over the last 3 days.  He has been having vomiting diarrhea multiple times a day.  He is unable to keep liquids down.  He took some Pepto-Bismol yesterday which only made his nausea vomiting worse.  Came to the ER for evaluation.  On arrival, temp 98.9 heart rate 152 blood pressure 86/58 satting 96% on room air.  White count 9.4, hemoglobin 15.9, platelets of 272  Initial lactic acid 2.6  BUN of 23, creatinine 2.5  After IV fluids, repeat lactic acid 1.6  [facility] hospitalist contacted for admission.  Blood cultures were obtained.  Patient given a dose of IV cefepime, Flagyl and vancomycin.   ED Course: hypotensive on arrival. Scr 2.5  Review of Systems:  Review of Systems  Constitutional: Negative.   HENT: Negative.    Eyes: Negative.   Respiratory: Negative.    Cardiovascular: Negative.   Gastrointestinal:  Positive for abdominal pain, diarrhea, nausea and vomiting.  Genitourinary: Negative.   Musculoskeletal: Negative.   Skin: Negative.   Neurological: Negative.   Endo/Heme/Allergies: Negative.   Psychiatric/Behavioral: Negative.    All other systems reviewed and are negative.   Past Medical History:  Diagnosis Date   Acne    Eczema    Leg fracture     History reviewed. No pertinent surgical history.   reports that he has never smoked.  He has never used smokeless tobacco. He reports current alcohol use of about 32.0 standard drinks of alcohol per week. He reports that he does not use drugs.  Allergies  Allergen Reactions   Banana Other (See Comments)    vomiting   Pollen Extract     Watery Eyes, Nose & Itchy Throat.    Family History  Problem Relation Age of Onset   Hypertension Father    Cancer Other    Hypertension Other     Prior to Admission medications   Not on File    Physical Exam: Vitals:   01/30/22 0200 01/30/22 0215 01/30/22 0230 01/30/22 0246  BP: 113/79 117/78 117/77 (!) 90/56  Pulse:      Resp: 20 16 15 17   Temp:      TempSrc:      SpO2:      Weight:      Height:        Physical Exam Vitals and nursing note reviewed.  Constitutional:      General: He is not in acute distress.    Appearance: Normal appearance. He is ill-appearing. He is not toxic-appearing or diaphoretic.  HENT:     Head: Normocephalic and atraumatic.     Nose: Nose normal.  Cardiovascular:     Rate and Rhythm: Regular rhythm. Tachycardia present.  Pulmonary:     Effort: Pulmonary effort is normal.     Breath sounds: Normal breath sounds.  Abdominal:     General: Bowel sounds  are increased.     Tenderness: There is generalized abdominal tenderness. There is no guarding or rebound.  Musculoskeletal:     Right lower leg: No edema.     Left lower leg: No edema.  Skin:    General: Skin is warm and dry.     Capillary Refill: Capillary refill takes less than 2 seconds.  Neurological:     General: No focal deficit present.     Mental Status: He is alert and oriented to person, place, and time.      Labs on Admission: I have personally reviewed following labs and imaging studies  CBC: Recent Labs  Lab 01/29/22 2335  WBC 9.4  NEUTROABS 7.9*  HGB 15.9  HCT 46.8  MCV 84.2  PLT [id number]   Basic Metabolic Panel: Recent Labs  Lab 01/29/22 2335  NA 133*  K 3.8  CL 95*  CO2 25  GLUCOSE 150*  BUN 23*   CREATININE 2.57*  CALCIUM 9.3   GFR: Estimated Creatinine Clearance: 43 mL/min (A) (by C-G formula based on SCr of 2.57 mg/dL (H)). Liver Function Tests: Recent Labs  Lab 01/29/22 2335  AST 41  ALT 37  ALKPHOS 50  BILITOT 1.3*  PROT 8.1  ALBUMIN 4.2   Recent Labs  Lab 01/29/22 2335  LIPASE 32   No results for input(s): "AMMONIA" in the last 168 hours. Coagulation Profile: Recent Labs  Lab 01/30/22 0130  INR 1.1   Cardiac Enzymes: No results for input(s): "CKTOTAL", "CKMB", "CKMBINDEX", "TROPONINI", "TROPONINIHS" in the last 168 hours. BNP (last 3 results) No results for input(s): "PROBNP" in the last 8760 hours. HbA1C: No results for input(s): "HGBA1C" in the last 72 hours. CBG: No results for input(s): "GLUCAP" in the last 168 hours. Lipid Profile: No results for input(s): "CHOL", "HDL", "LDLCALC", "TRIG", "CHOLHDL", "LDLDIRECT" in the last 72 hours. Thyroid Function Tests: No results for input(s): "TSH", "T4TOTAL", "FREET4", "T3FREE", "THYROIDAB" in the last 72 hours. Anemia Panel: No results for input(s): "VITAMINB12", "FOLATE", "FERRITIN", "TIBC", "IRON", "RETICCTPCT" in the last 72 hours. Urine analysis: No results found for: "COLORURINE", "APPEARANCEUR", "LABSPEC", "PHURINE", "GLUCOSEU", "HGBUR", "BILIRUBINUR", "KETONESUR", "PROTEINUR", "UROBILINOGEN", "NITRITE", "LEUKOCYTESUR"  Radiological Exams on Admission: I have personally reviewed images DG Chest Port 1 View  Result Date: 01/30/2022 CLINICAL DATA:  Nausea/vomiting/diarrhea, sepsis EXAM: PORTABLE CHEST 1 VIEW COMPARISON:  None Available. FINDINGS: Lungs are clear.  No pleural effusion or pneumothorax. The heart is normal in size. IMPRESSION: No evidence of acute cardiopulmonary disease. Electronically Signed   By: Julian Hy M.D.   On: 01/30/2022 01:05    EKG: My personal interpretation of EKG shows: sinus tachycardia    Assessment/Plan Principal Problem:   Food poisoning Active  Problems:   AKI (acute kidney injury) ([place])   SIRS of non-infectious origin w acute organ dysfunction (HCC)    Assessment and Plan: * Food poisoning Admit to med/tele observation bed. Continue with supportive care. IV compazine prn for N/V.  SIRS of non-infectious origin w acute organ dysfunction (HCC) Hypotension due to hypovolemia. Scr due to hypovolemia. Pt does not have sepsis but rather fits SIRS criteria due to non-infectious etiology. Hold on ABX for now. If diarrhea continues, could consider GI diarrhea panel but unless his blood cx turn positive for salmonella or E. Coli, I would still hold for now.  AKI (acute kidney injury) ([place]) Continue with IVF. Likely due to N/V/D. Do not give immodium or lomotil or other anti-diarrheal meds.   DVT prophylaxis: SCDs Code Status:  Full Code Family Communication: no family at bedside  Disposition Plan: return home  Consults called: none  Admission status: Observation, Telemetry bed   Kristopher Oppenheim, DO [facility] 01/30/2022, 3:06 AM

## 2022-01-30 NOTE — Progress Notes (Signed)
Patient admitted to the hospital earlier this morning by Dr. Imogene Burn.  He was admitted to the hospital with persistent vomiting and diarrhea, felt to be related to infectious enteritis/colitis.  He has associated dehydration, hypotension and acute kidney injury.  He has been receiving IV fluids and is feeling better.  Last episode of diarrhea was earlier this morning.  He has not had any p.o. intake as of yet.  Abdomen is soft, nontender.  CT of his abdomen showed transverse colitis.  Initial creatinine 2.5 which has improved to 2.2 with some IV fluids.  No known history of kidney disease.  Magnesium is also low at 1.5 and is being replaced.  LFTs are otherwise unremarkable.  Lactic acid elevated 2.6 on admission which has improved to 1.6 with IV fluids.  Suspect that his GI illness is viral in origin.  We will check GI pathogen panel.  Continue to treat supportively with IV fluids, antiemetics.  Started on clear liquids, if he can tolerate this will advance to soft foods.  Continue to replace electrolytes.  Anticipate discharge in the next 24 hours if labs continue to improve with hydration and is able to tolerate p.o. intake without persistent GI symptoms.  [employer]

## 2022-01-30 NOTE — Subjective & Objective (Signed)
CC: N/V/D HPI: 21 year old African-American male with no significant medical history presents to the ER today with a 3-day history of nausea vomiting diarrhea.  He states that he had some leftover buffalo chicken dip from the restaurant [employer] on Saturday evening.  This was around 10 PM.  By midnight, he was having vomiting and diarrhea.  This continued over the last 3 days.  He has been having vomiting diarrhea multiple times a day.  He is unable to keep liquids down.  He took some Pepto-Bismol yesterday which only made his nausea vomiting worse.  Came to the ER for evaluation.  On arrival, temp 98.9 heart rate 152 blood pressure 86/58 satting 96% on room air.  White count 9.4, hemoglobin 15.9, platelets of 272  Initial lactic acid 2.6  BUN of 23, creatinine 2.5  After IV fluids, repeat lactic acid 1.6  [facility] hospitalist contacted for admission.  Blood cultures were obtained.  Patient given a dose of IV cefepime, Flagyl and vancomycin.

## 2022-01-30 NOTE — Sepsis Progress Note (Signed)
Following for sepsis monitoring ?

## 2022-01-30 NOTE — Assessment & Plan Note (Signed)
Hypotension due to hypovolemia. Scr due to hypovolemia. Pt does not have sepsis but rather fits SIRS criteria due to non-infectious etiology. Hold on ABX for now. If diarrhea continues, could consider GI diarrhea panel but unless his blood cx turn positive for salmonella or E. Coli, I would still hold for now.

## 2022-01-30 NOTE — ED Notes (Signed)
Patient denies pain and is resting comfortably.  

## 2022-01-30 NOTE — ED Notes (Signed)
Pt's mother at bedside. Updated pt's mother on pt status and plan of care

## 2022-01-30 NOTE — ED Notes (Signed)
Recollect blood sample sent down for CMP and Mag

## 2022-01-30 NOTE — Assessment & Plan Note (Signed)
Admit to med/tele observation bed. Continue with supportive care. IV compazine prn for N/V.

## 2022-01-30 NOTE — ED Notes (Signed)
ED Provider at bedside. 

## 2022-01-31 DIAGNOSIS — K529 Noninfective gastroenteritis and colitis, unspecified: Secondary | ICD-10-CM

## 2022-01-31 LAB — CBC
HCT: 33.6 % — ABNORMAL LOW (ref 39.0–52.0)
Hemoglobin: 11.2 g/dL — ABNORMAL LOW (ref 13.0–17.0)
MCH: 28.6 pg (ref 26.0–34.0)
MCHC: 33.3 g/dL (ref 30.0–36.0)
MCV: 85.7 fL (ref 80.0–100.0)
Platelets: 207 10*3/uL (ref 150–400)
RBC: 3.92 MIL/uL — ABNORMAL LOW (ref 4.22–5.81)
RDW: 13.3 % (ref 11.5–15.5)
WBC: 8.9 10*3/uL (ref 4.0–10.5)
nRBC: 0 % (ref 0.0–0.2)

## 2022-01-31 LAB — COMPREHENSIVE METABOLIC PANEL
ALT: 24 U/L (ref 0–44)
AST: 30 U/L (ref 15–41)
Albumin: 2.8 g/dL — ABNORMAL LOW (ref 3.5–5.0)
Alkaline Phosphatase: 36 U/L — ABNORMAL LOW (ref 38–126)
Anion gap: 10 (ref 5–15)
BUN: 12 mg/dL (ref 6–20)
CO2: 29 mmol/L (ref 22–32)
Calcium: 8.4 mg/dL — ABNORMAL LOW (ref 8.9–10.3)
Chloride: 101 mmol/L (ref 98–111)
Creatinine, Ser: 2.05 mg/dL — ABNORMAL HIGH (ref 0.61–1.24)
GFR, Estimated: 46 mL/min — ABNORMAL LOW (ref >60)
Glucose, Bld: 104 mg/dL — ABNORMAL HIGH (ref 70–99)
Potassium: 3.8 mmol/L (ref 3.5–5.1)
Sodium: 140 mmol/L (ref 135–145)
Total Bilirubin: 0.5 mg/dL (ref 0.3–1.2)
Total Protein: 5.2 g/dL — ABNORMAL LOW (ref 6.5–8.1)

## 2022-01-31 LAB — MAGNESIUM: Magnesium: 2.3 mg/dL (ref 1.7–2.4)

## 2022-01-31 LAB — HIV ANTIBODY (ROUTINE TESTING W REFLEX): HIV Screen 4th Generation wRfx: NONREACTIVE

## 2022-01-31 NOTE — Plan of Care (Signed)

## 2022-01-31 NOTE — TOC Transition Note (Signed)
Transition of Care [facility]) - CM/SW Discharge Note   Patient Details  Name: Garrett Cook MRN: [id number] Date of Birth: 2000/11/30  Transition of Care [facility]) CM/SW Contact:  Tom-Johnson, Hershal Coria, RN Phone Number: 01/31/2022, 12:18 PM   Clinical Narrative:     Patient is scheduled for discharge today. No TOC needs or recommendations noted. Denies any needs. Family to transport at discharge. No further TOC needs noted.     Final next level of care: Home/Self Care Barriers to Discharge: Barriers Resolved   Patient Goals and CMS Choice Patient states their goals for this hospitalization and ongoing recovery are:: To return home CMS Medicare.gov Compare Post Acute Care list provided to:: Patient Choice offered to / list presented to : NA  Discharge Placement                Patient to be transferred to facility by: Family      Discharge Plan and Services                DME Arranged: N/A DME Agency: NA       HH Arranged: NA HH Agency: NA        Social Determinants of Health (SDOH) Interventions     Readmission Risk Interventions     No data to display

## 2022-01-31 NOTE — Progress Notes (Signed)
Patient appetite has returned. Patient did not experience any nausea or vomiting. Patient also stated that he feels better. Patient has not had a bowel movement to provide a stool specimen for rule out.

## 2022-01-31 NOTE — Discharge Summary (Signed)
Physician Discharge Summary  Garrett Cook MVH:[id number] DOB: 20-May-2000 DOA: 01/29/2022  PCP: Patient, No Pcp Per  Admit date: 01/29/2022 Discharge date: 01/31/2022  Admitted From: Home  Discharge disposition: Home   Recommendations for Outpatient Follow-Up:   Follow up with your primary care provider in 3 to 5 days for BMP checkup.  Discharge Diagnosis:   Active Problems:   AKI (acute kidney injury) (HCC)   Gastroenteritis Food poisoning -resolved.  Discharge Condition: Improved.  Diet recommendation: Regular.  Wound care: None.  Code status: Full.   History of Present Illness:   Patient is a 21 years old male with no significant past medical history presented to hospital with nausea vomiting diarrhea after eating leftover buffalo chicken dip from restaurant.  He then started having vomiting and diarrhea which has lasted for 3 days multiple times a day and was unable to keep oral liquids down.  In the ED patient was afebrile but was hypotensive.  Labs were within normal limits except for elevated lactate at 2.6 and creatinine elevated at 2.5.  Lactate normalized after IV fluid. Patient was seen at the hospital for further evaluation and treatment.  Hospital Course:   Following conditions were addressed during hospitalization as listed below,   Food poisoning Patient was given IV fluids antiemetics with improvement in his symptoms.  At this time patient has tolerated oral diet and has been drinking water well.  Received IV fluids during hospitalization.   AKI (acute kidney injury) Secondary to volume depletion from nausea vomiting diarrhea.  Has slightly improved with IV hydration.Patient is positive balance for 543 4 mL.  Lactate has normalized after IV fluid hydration.  Patient clinically feels better and has been having good urinary output.  He is to follow-up with his primary care provider in 3 to 5 days to check BMP.  Advised to increase fluid intake I explained  the patient that his renal function was still elevated at 2.0 and would need follow-up with PCP.  He has understood this and is adamant about going home today.Marland Kitchen  He was advised to come to the hospital for worsening symptoms.  Disposition.  At this time, patient is stable for disposition home with outpatient PCP follow-up in 3 to 5 days for BMP checkup.  Medical Consultants:   None.  Procedures:    None Subjective:   Today, patient was seen and examined at bedside.  Feels better.  No nausea vomiting abdominal pain.  Wishes to go home.  Has been urinating well.  Has tolerated oral diet.  Discharge Exam:   Vitals:   01/31/22 0510 01/31/22 0846  BP: 106/70 112/61  Pulse: 94 70  Resp:  17  Temp: 97.7 F (36.5 C) 98.2 F (36.8 C)  SpO2: 100% 99%   Vitals:   01/30/22 1715 01/30/22 2008 01/31/22 0510 01/31/22 0846  BP: (!) 102/59 106/66 106/70 112/61  Pulse: 84 79 94 70  Resp: 16 18  17   Temp: (!) 97.4 F (36.3 C) 98 F (36.7 C) 97.7 F (36.5 C) 98.2 F (36.8 C)  TempSrc:  Oral Oral Oral  SpO2: 98% 100% 100% 99%  Weight:  68 kg    Height:  5\' 2"  (1.575 m)     General: Alert awake, not in obvious distress HENT: pupils equally reacting to light,  No scleral pallor or icterus noted. Oral mucosa is moist.  Chest:  Clear breath sounds.  Diminished breath sounds bilaterally. No crackles or wheezes.  CVS: S1 &S2 heard. No murmur.  Regular rate and rhythm. Abdomen: Soft, nontender, nondistended.  Bowel sounds are heard.   Extremities: No cyanosis, clubbing or edema.  Peripheral pulses are palpable. Psych: Alert, awake and oriented, normal mood CNS:  No cranial nerve deficits.  Power equal in all extremities.   Skin: Warm and dry.  No rashes noted.  The results of significant diagnostics from this hospitalization (including imaging, microbiology, ancillary and laboratory) are listed below for reference.     Diagnostic Studies:   CT Renal Stone Study  Result Date:  01/30/2022 CLINICAL DATA:  Abdominal pain, nausea/vomiting/diarrhea EXAM: CT ABDOMEN AND PELVIS WITHOUT CONTRAST TECHNIQUE: Multidetector CT imaging of the abdomen and pelvis was performed following the standard protocol without IV contrast. RADIATION DOSE REDUCTION: This exam was performed according to the departmental dose-optimization program which includes automated exposure control, adjustment of the mA and/or kV according to patient size and/or use of iterative reconstruction technique. COMPARISON:  None Available. FINDINGS: Lower chest: Lung bases are clear. Hepatobiliary: Unenhanced liver is unremarkable. Gallbladder is decompressed. No intrahepatic or extrahepatic duct dilatation. Pancreas: Within normal limits Spleen: Within normal limits Adrenals/Urinary Tract: Adrenal glands are within normal limits. Kidneys are normal limits.  No renal calculi or hydronephrosis. Bladder is underdistended but unremarkable. Stomach/Bowel: Stomach is within normal limits. No evidence of bowel obstruction. Normal appendix (series 3/image 46). Wall thickening/inflammatory changes involving the proximal transverse colon (series 3/image 31), suggesting infectious/inflammatory colitis. No pneumatosis. No drainable fluid collection/abscess. No free air. Vascular/Lymphatic: No evidence of abdominal aortic aneurysm. No suspicious abdominopelvic lymphadenopathy. Reproductive: Prostate is unremarkable. Other: Trace right pelvic fluid (series 3/image 62). Musculoskeletal: Visualized osseous structures are within normal limits. IMPRESSION: Wall thickening/inflammatory changes involving the proximal transverse colon, suggesting infectious/inflammatory colitis. No drainable fluid collection/abscess. No pneumatosis or free air. Electronically Signed   By: Charline Bills M.D.   On: 01/30/2022 03:52   DG Chest Port 1 View  Result Date: 01/30/2022 CLINICAL DATA:  Nausea/vomiting/diarrhea, sepsis EXAM: PORTABLE CHEST 1 VIEW  COMPARISON:  None Available. FINDINGS: Lungs are clear.  No pleural effusion or pneumothorax. The heart is normal in size. IMPRESSION: No evidence of acute cardiopulmonary disease. Electronically Signed   By: Charline Bills M.D.   On: 01/30/2022 01:05     Labs:   Basic Metabolic Panel: Recent Labs  Lab 01/29/22 2335 01/30/22 0322 01/30/22 0414 01/30/22 1727 01/31/22 0715  NA 133* 131* 136 137 140  K 3.8 3.9 4.1 4.0 3.8  CL 95* 106 99 104 101  CO2 25 10* 24 25 29   GLUCOSE 150* 57* 136* 113* 104*  BUN 23* 9 23* 17 12  CREATININE 2.57* 0.73 2.27* 2.11* 2.05*  CALCIUM 9.3 6.4* 8.1* 8.3* 8.4*  MG  --  <0.5* 1.5*  --  2.3   GFR Estimated Creatinine Clearance: 47.6 mL/min (A) (by C-G formula based on SCr of 2.05 mg/dL (H)). Liver Function Tests: Recent Labs  Lab 01/29/22 2335 01/30/22 0322 01/30/22 0414 01/30/22 1727 01/31/22 0715  AST 41 11* 31 32 30  ALT 37 8 26 23 24   ALKPHOS 50 9* 34* 36* 36*  BILITOT 1.3* 0.4 0.8 0.5 0.5  PROT 8.1 <3.0* 5.5* 5.4* 5.2*  ALBUMIN 4.2 <1.5* 2.9* 2.9* 2.8*   Recent Labs  Lab 01/29/22 2335  LIPASE 32   No results for input(s): "AMMONIA" in the last 168 hours. Coagulation profile Recent Labs  Lab 01/30/22 0130  INR 1.1    CBC: Recent Labs  Lab 01/29/22 2335 01/30/22 0439 01/31/22 0715  WBC  9.4 8.0 8.9  NEUTROABS 7.9* 6.4  --   HGB 15.9 11.6* 11.2*  HCT 46.8 34.6* 33.6*  MCV 84.2 85.2 85.7  PLT 272 202 207   Cardiac Enzymes: Recent Labs  Lab 01/29/22 2335  CKTOTAL 134   BNP: Invalid input(s): "POCBNP" CBG: Recent Labs  Lab 01/30/22 0407  GLUCAP 132*   D-Dimer No results for input(s): "DDIMER" in the last 72 hours. Hgb A1c No results for input(s): "HGBA1C" in the last 72 hours. Lipid Profile No results for input(s): "CHOL", "HDL", "LDLCALC", "TRIG", "CHOLHDL", "LDLDIRECT" in the last 72 hours. Thyroid function studies No results for input(s): "TSH", "T4TOTAL", "T3FREE", "THYROIDAB" in the last 72  hours.  Invalid input(s): "FREET3" Anemia work up No results for input(s): "VITAMINB12", "FOLATE", "FERRITIN", "TIBC", "IRON", "RETICCTPCT" in the last 72 hours. Microbiology Recent Results (from the past 240 hour(s))  Resp Panel by RT-PCR (Flu A&B, Covid) Anterior Nasal Swab     Status: None   Collection Time: 01/29/22 11:45 PM   Specimen: Anterior Nasal Swab  Result Value Ref Range Status   SARS Coronavirus 2 by RT PCR NEGATIVE NEGATIVE Final    Comment: (NOTE) SARS-CoV-2 target nucleic acids are NOT DETECTED.  The SARS-CoV-2 RNA is generally detectable in upper respiratory specimens during the acute phase of infection. The lowest concentration of SARS-CoV-2 viral copies this assay can detect is 138 copies/mL. A negative result does not preclude SARS-Cov-2 infection and should not be used as the sole basis for treatment or other patient management decisions. A negative result may occur with  improper specimen collection/handling, submission of specimen other than nasopharyngeal swab, presence of viral mutation(s) within the areas targeted by this assay, and inadequate number of viral copies(<138 copies/mL). A negative result must be combined with clinical observations, patient history, and epidemiological information. The expected result is Negative.  Fact Sheet for Patients:  BloggerCourse.comhttps://www.fda.gov/media/152166/download  Fact Sheet for Healthcare Providers:  SeriousBroker.ithttps://www.fda.gov/media/152162/download  This test is no t yet approved or cleared by the [place] FDA and  has been authorized for detection and/or diagnosis of SARS-CoV-2 by FDA under an Emergency Use Authorization (EUA). This EUA will remain  in effect (meaning this test can be used) for the duration of the COVID-19 declaration under Section 564(b)(1) of the Act, 21 U.S.C.section 360bbb-3(b)(1), unless the authorization is terminated  or revoked sooner.       Influenza A by PCR NEGATIVE NEGATIVE Final    Influenza B by PCR NEGATIVE NEGATIVE Final    Comment: (NOTE) The Xpert Xpress SARS-CoV-2/FLU/RSV plus assay is intended as an aid in the diagnosis of influenza from Nasopharyngeal swab specimens and should not be used as a sole basis for treatment. Nasal washings and aspirates are unacceptable for Xpert Xpress SARS-CoV-2/FLU/RSV testing.  Fact Sheet for Patients: BloggerCourse.comhttps://www.fda.gov/media/152166/download  Fact Sheet for Healthcare Providers: SeriousBroker.ithttps://www.fda.gov/media/152162/download  This test is not yet approved or cleared by the [place] FDA and has been authorized for detection and/or diagnosis of SARS-CoV-2 by FDA under an Emergency Use Authorization (EUA). This EUA will remain in effect (meaning this test can be used) for the duration of the COVID-19 declaration under Section 564(b)(1) of the Act, 21 U.S.C. section 360bbb-3(b)(1), unless the authorization is terminated or revoked.  Performed at [facility], [address] [place], [place] [address]   Blood culture (routine x 2)     Status: None (Preliminary result)   Collection Time: 01/30/22  1:30 AM   Specimen: BLOOD  Result Value Ref Range  Status   Specimen Description BLOOD RIGHT ANTECUBITAL  Final   Special Requests   Final    BOTTLES DRAWN AEROBIC AND ANAEROBIC Blood Culture adequate volume   Culture   Final    NO GROWTH 1 DAY Performed at [facility], [address] [place], [place] [address]    Report Status PENDING  Incomplete  Blood culture (routine x 2)     Status: None (Preliminary result)   Collection Time: 01/30/22  1:32 AM   Specimen: BLOOD RIGHT FOREARM  Result Value Ref Range Status   Specimen Description BLOOD RIGHT FOREARM  Final   Special Requests   Final    BOTTLES DRAWN AEROBIC AND ANAEROBIC Blood Culture adequate volume   Culture   Final    NO GROWTH 1 DAY Performed at [facility], [address], [place], [place] [address]    Report Status PENDING  Incomplete      Discharge Instructions:   Discharge Instructions     Call MD for:  persistant nausea and vomiting   Complete by: As directed    Call MD for:  severe uncontrolled pain   Complete by: As directed    Call MD for:  temperature >100.4   Complete by: As directed    Diet general   Complete by: As directed    Discharge instructions   Complete by: As directed    Drink plenty of fluids, check blood work (BMP) in 3-5 days. Seek medical help for worsening symptoms   Increase activity slowly   Complete by: As directed       Allergies as of 01/31/2022       Reactions   Banana Other (See Comments)   vomiting   Pollen Extract    Watery Eyes, Nose & Itchy Throat.        Medication List    You have not been prescribed any medications.     Follow-up Information     primary care provider Follow up in 3 day(s).   Why: for blood work with BMP                 Time coordinating discharge: 39 minutes  Signed:  Chandon Cook  [facility] 01/31/2022, 12:58 PM

## 2022-02-01 LAB — URINE CULTURE
Culture: NO GROWTH
Special Requests: NORMAL

## 2022-02-04 LAB — CULTURE, BLOOD (ROUTINE X 2)
Culture: NO GROWTH
Culture: NO GROWTH
Special Requests: ADEQUATE
Special Requests: ADEQUATE

## 2022-02-11 LAB — MAGNESIUM: Magnesium: <0.5 mg/dL — CL (ref 1.7–2.4)

## 2022-03-08 ENCOUNTER — Encounter: Payer: Self-pay | Admitting: Internal Medicine

## 2022-03-08 ENCOUNTER — Ambulatory Visit: Payer: Medicaid Other | Admitting: Internal Medicine

## 2022-03-08 VITALS — BP 103/66 | HR 102 | Temp 98.6°F | Resp 12 | Ht 62.0 in | Wt 155.6 lb

## 2022-03-08 DIAGNOSIS — D649 Anemia, unspecified: Secondary | ICD-10-CM | POA: Diagnosis not present

## 2022-03-08 DIAGNOSIS — N179 Acute kidney failure, unspecified: Secondary | ICD-10-CM | POA: Diagnosis not present

## 2022-03-08 DIAGNOSIS — R770 Abnormality of albumin: Secondary | ICD-10-CM | POA: Diagnosis not present

## 2022-03-08 DIAGNOSIS — Z0000 Encounter for general adult medical examination without abnormal findings: Secondary | ICD-10-CM

## 2022-03-08 DIAGNOSIS — Z7251 High risk heterosexual behavior: Secondary | ICD-10-CM | POA: Diagnosis not present

## 2022-03-08 DIAGNOSIS — R319 Hematuria, unspecified: Secondary | ICD-10-CM | POA: Diagnosis not present

## 2022-03-08 HISTORY — DX: Hematuria, unspecified: R31.9

## 2022-03-08 NOTE — Progress Notes (Signed)
[place]  Phone: [phone]  New patient visit  Visit Date: 03/08/2022 Patient: Garrett Cook   DOB: 28-May-2000   21 y.o. Male  MRN: [id number]  Today's healthcare provider: Loralee Pacas, MD  Assessment and Plan:   In addition to meeting and establishing as Primary Care Provider (PCP), I also dscvd D   Garrett Cook was seen today for establish care.  Acute kidney injury ([place]) -     POCT urinalysis dipstick; Future -     Lipid panel; Future -     Urinalysis, Routine w reflex microscopic; Future -     UA/M w/rflx Culture, Comp; Future  Anemia, unspecified type -     Lipid panel; Future  Low serum albumin -     Lipid panel; Future  Hematuria, unspecified type -     POCT urinalysis dipstick; Future -     Lipid panel; Future  High risk heterosexual behavior -     HIV Antibody (routine testing w rflx); Future -     Hepatitis C antibody; Future -     RPR; Future -     Cytology (oral, anal, urethral) ancillary only; Future  Encounter for preventive care -     Lipid panel; Future       Subjective:  Patient presents today to establish care.  Chief Complaint  Patient presents with   Establish Care    No concerns.    Problem-oriented charting was used to develop and update his medical history: Problem  Anemia  Low Serum Albumin  Hematuria  History of Colitis   In addition he requested std screening based on sexual history reports he is straight.  Initially patient reports no issues just needed to meet and establish but reviewed recent emergency room visit for food poisoning and he had several abnormal labs- he would prefer to wait til next week to follow up, and plans return for cpe and would like to go ahead and get preventive labs done with this draw, including preventive std testingww  Depression Screen    03/08/2022    3:17 PM  PHQ 2/9 Scores  PHQ - 2 Score 0   No results found for any visits on 03/08/22.  The following were reviewed  and entered/updated into his MEDICAL RECORD [place] History:  Diagnosis Date   Acne    Allergy    Eczema    Leg fracture    History reviewed. No pertinent surgical history. Family History  Problem Relation Age of Onset   Diabetes Father    Hypertension Father    Cancer Other    Hypertension Other    No outpatient medications prior to visit.   No facility-administered medications prior to visit.    Allergies  Allergen Reactions   Banana Other (See Comments)    vomiting   Pollen Extract     Watery Eyes, Nose & Itchy Throat.   Social History   Tobacco Use   Smoking status: Never   Smokeless tobacco: Never  Vaping Use   Vaping Use: Every day  Substance Use Topics   Alcohol use: Yes    Alcohol/week: 4.0 standard drinks of alcohol    Types: 4 Shots of liquor per week   Drug use: Not Currently    Types: Marijuana    Immunization History  Administered Date(s) Administered   DTaP 03/27/1999, 06/20/1999, 07/20/2000, 10/27/2003   HIB (PRP-OMP) 01/23/1999, 03/27/1999, 06/20/1999, 07/20/2000   HPV 9-valent 11/02/2014   HPV Quadrivalent 09/09/2012  Hepatitis A 11/09/2009, 09/09/2012   Hepatitis B 01/23/1999, 03/27/1999, 07/21/1999   IPV 01/23/1999, 03/27/1999, 06/20/1999, 10/27/2003   MMR 01/23/2000, 10/27/2003   Meningococcal Conjugate 09/09/2012, 08/21/2016   Pneumococcal Conjugate-13 06/20/1999, 07/20/2000   Tdap 11/09/2009   Varicella 01/23/2000, 11/09/2009    Objective:  BP 103/66 (BP Location: Right Arm, Patient Position: Sitting)   Pulse (!) 102   Temp 98.6 F (37 C) (Temporal)   Resp 12   Ht _0  (1.575 m)   Wt 155 lb 9.6 oz (70.6 kg)   SpO2 99%   BMI 28.46 kg/m  Body mass index is 28.46 kg/m.  Physical Exam  Vital signs reviewed.  Nursing notes reviewed. General Appearance/Constitutional:   Overweight male in no acute distress Musculoskeletal: All extremities are intact.  Neurological:  Awake, alert,  No obvious focal neurological deficits  or cognitive impairments Psychiatric:  Appropriate mood, pleasant demeanor Problem-specific findings:  no abdominal tenderness, pain, or distension oral mucosa moist and healthy appearing     Results Reviewed: Results for orders placed or performed during the hospital encounter of 01/29/22  Blood culture (routine x 2)   Specimen: BLOOD  Result Value Ref Range   Specimen Description BLOOD RIGHT ANTECUBITAL    Special Requests      BOTTLES DRAWN AEROBIC AND ANAEROBIC Blood Culture adequate volume   Culture      NO GROWTH 5 DAYS Performed at [facility], [place] [address] [place], [place] [address]    Report Status 02/04/2022 FINAL   Blood culture (routine x 2)   Specimen: BLOOD RIGHT FOREARM  Result Value Ref Range   Specimen Description BLOOD RIGHT FOREARM    Special Requests      BOTTLES DRAWN AEROBIC AND ANAEROBIC Blood Culture adequate volume   Culture      NO GROWTH 5 DAYS Performed at [facility], [place] [address], [place], [place] [address]    Report Status 02/04/2022 FINAL   Resp Panel by RT-PCR (Flu A&B, Covid) Anterior Nasal Swab   Specimen: Anterior Nasal Swab  Result Value Ref Range   SARS Coronavirus 2 by RT PCR NEGATIVE NEGATIVE   Influenza A by PCR NEGATIVE NEGATIVE   Influenza B by PCR NEGATIVE NEGATIVE  Urine Culture   Specimen: In/Out Cath Urine  Result Value Ref Range   Specimen Description IN/OUT CATH URINE    Special Requests Normal    Culture      NO GROWTH Performed at [facility], [place] [address], [place], [place] [address]    Report Status 02/01/2022 FINAL   CBC with Differential  Result Value Ref Range   WBC 9.4 4.0 - 10.5 K/uL   RBC 5.56 4.22 - 5.81 MIL/uL   Hemoglobin 15.9 13.0 - 17.0 g/dL   HCT 46.8 39.0 - 52.0 %   MCV 84.2 80.0 - 100.0 fL   MCH 28.6 26.0 - 34.0 pg   MCHC 34.0 30.0 - 36.0 g/dL   RDW 12.9 11.5 - 15.5 %   Platelets 272 150 - 400 K/uL   nRBC 0.0 0.0 - 0.2 %   Neutrophils Relative % 84 %   Neutro Abs 7.9  (H) 1.7 - 7.7 K/uL   Lymphocytes Relative 11 %   Lymphs Abs 1.0 0.7 - 4.0 K/uL   Monocytes Relative 4 %   Monocytes Absolute 0.4 0.1 - 1.0 K/uL   Eosinophils Relative 0 %   Eosinophils Absolute 0.0 0.0 - 0.5 K/uL   Basophils Relative  0 %   Basophils Absolute 0.0 0.0 - 0.1 K/uL   Immature Granulocytes 1 %   Abs Immature Granulocytes 0.12 (H) 0.00 - 0.07 K/uL  Comprehensive metabolic panel  Result Value Ref Range   Sodium 133 (L) 135 - 145 mmol/L   Potassium 3.8 3.5 - 5.1 mmol/L   Chloride 95 (L) 98 - 111 mmol/L   CO2 25 22 - 32 mmol/L   Glucose, Bld 150 (H) 70 - 99 mg/dL   BUN 23 (H) 6 - 20 mg/dL   Creatinine, Ser 2.57 (H) 0.61 - 1.24 mg/dL   Calcium 9.3 8.9 - 10.3 mg/dL   Total Protein 8.1 6.5 - 8.1 g/dL   Albumin 4.2 3.5 - 5.0 g/dL   AST 41 15 - 41 U/L   ALT 37 0 - 44 U/L   Alkaline Phosphatase 50 38 - 126 U/L   Total Bilirubin 1.3 (H) 0.3 - 1.2 mg/dL   GFR, Estimated 35 (L) >60 mL/min   Anion gap 13 5 - 15  Lipase, blood  Result Value Ref Range   Lipase 32 11 - 51 U/L  Urinalysis, Routine w reflex microscopic Urine, Clean Catch  Result Value Ref Range   Color, Urine STRAW (A) YELLOW   APPearance CLEAR CLEAR   Specific Gravity, Urine 1.005 1.005 - 1.030   pH 6.0 5.0 - 8.0   Glucose, UA NEGATIVE NEGATIVE mg/dL   Hgb urine dipstick MODERATE (A) NEGATIVE   Bilirubin Urine NEGATIVE NEGATIVE   Ketones, ur NEGATIVE NEGATIVE mg/dL   Protein, ur NEGATIVE NEGATIVE mg/dL   Nitrite NEGATIVE NEGATIVE   Leukocytes,Ua NEGATIVE NEGATIVE   RBC / HPF 0-5 0 - 5 RBC/hpf   WBC, UA 0-5 0 - 5 WBC/hpf   Bacteria, UA NONE SEEN NONE SEEN  Lactic acid, plasma  Result Value Ref Range   Lactic Acid, Venous 2.6 (HH) 0.5 - 1.9 mmol/L  Lactic acid, plasma  Result Value Ref Range   Lactic Acid, Venous 1.6 0.5 - 1.9 mmol/L  CK  Result Value Ref Range   Total CK 134 49 - 397 U/L  Protime-INR  Result Value Ref Range   Prothrombin Time 14.5 11.4 - 15.2 seconds   INR 1.1 0.8 - 1.2  APTT   Result Value Ref Range   aPTT 33 24 - 36 seconds  Comprehensive metabolic panel  Result Value Ref Range   Sodium 131 (L) 135 - 145 mmol/L   Potassium 3.9 3.5 - 5.1 mmol/L   Chloride 106 98 - 111 mmol/L   CO2 10 (L) 22 - 32 mmol/L   Glucose, Bld 57 (L) 70 - 99 mg/dL   BUN 9 6 - 20 mg/dL   Creatinine, Ser 0.73 0.61 - 1.24 mg/dL   Calcium 6.4 (LL) 8.9 - 10.3 mg/dL   Total Protein <3.0 (L) 6.5 - 8.1 g/dL   Albumin <1.5 (L) 3.5 - 5.0 g/dL   AST 11 (L) 15 - 41 U/L   ALT 8 0 - 44 U/L   Alkaline Phosphatase 9 (L) 38 - 126 U/L   Total Bilirubin 0.4 0.3 - 1.2 mg/dL   GFR, Estimated >60 >60 mL/min   Anion gap 15 5 - 15  Magnesium  Result Value Ref Range   Magnesium <0.5 (LL) 1.7 - 2.4 mg/dL  CBC with Differential/Platelet  Result Value Ref Range   WBC 8.0 4.0 - 10.5 K/uL   RBC 4.06 (L) 4.22 - 5.81 MIL/uL   Hemoglobin 11.6 (L) 13.0 - 17.0 g/dL  HCT 34.6 (L) 39.0 - 52.0 %   MCV 85.2 80.0 - 100.0 fL   MCH 28.6 26.0 - 34.0 pg   MCHC 33.5 30.0 - 36.0 g/dL   RDW 12.8 11.5 - 15.5 %   Platelets 202 150 - 400 K/uL   nRBC 0.0 0.0 - 0.2 %   Neutrophils Relative % 80 %   Neutro Abs 6.4 1.7 - 7.7 K/uL   Lymphocytes Relative 13 %   Lymphs Abs 1.0 0.7 - 4.0 K/uL   Monocytes Relative 6 %   Monocytes Absolute 0.5 0.1 - 1.0 K/uL   Eosinophils Relative 0 %   Eosinophils Absolute 0.0 0.0 - 0.5 K/uL   Basophils Relative 0 %   Basophils Absolute 0.0 0.0 - 0.1 K/uL   Immature Granulocytes 1 %   Abs Immature Granulocytes 0.07 0.00 - 0.07 K/uL  Comprehensive metabolic panel  Result Value Ref Range   Sodium 136 135 - 145 mmol/L   Potassium 4.1 3.5 - 5.1 mmol/L   Chloride 99 98 - 111 mmol/L   CO2 24 22 - 32 mmol/L   Glucose, Bld 136 (H) 70 - 99 mg/dL   BUN 23 (H) 6 - 20 mg/dL   Creatinine, Ser 2.27 (H) 0.61 - 1.24 mg/dL   Calcium 8.1 (L) 8.9 - 10.3 mg/dL   Total Protein 5.5 (L) 6.5 - 8.1 g/dL   Albumin 2.9 (L) 3.5 - 5.0 g/dL   AST 31 15 - 41 U/L   ALT 26 0 - 44 U/L   Alkaline Phosphatase 34  (L) 38 - 126 U/L   Total Bilirubin 0.8 0.3 - 1.2 mg/dL   GFR, Estimated 41 (L) >60 mL/min   Anion gap 13 5 - 15  Magnesium  Result Value Ref Range   Magnesium 1.5 (L) 1.7 - 2.4 mg/dL  Comprehensive metabolic panel  Result Value Ref Range   Sodium 137 135 - 145 mmol/L   Potassium 4.0 3.5 - 5.1 mmol/L   Chloride 104 98 - 111 mmol/L   CO2 25 22 - 32 mmol/L   Glucose, Bld 113 (H) 70 - 99 mg/dL   BUN 17 6 - 20 mg/dL   Creatinine, Ser 2.11 (H) 0.61 - 1.24 mg/dL   Calcium 8.3 (L) 8.9 - 10.3 mg/dL   Total Protein 5.4 (L) 6.5 - 8.1 g/dL   Albumin 2.9 (L) 3.5 - 5.0 g/dL   AST 32 15 - 41 U/L   ALT 23 0 - 44 U/L   Alkaline Phosphatase 36 (L) 38 - 126 U/L   Total Bilirubin 0.5 0.3 - 1.2 mg/dL   GFR, Estimated 44 (L) >60 mL/min   Anion gap 8 5 - 15  Comprehensive metabolic panel  Result Value Ref Range   Sodium 140 135 - 145 mmol/L   Potassium 3.8 3.5 - 5.1 mmol/L   Chloride 101 98 - 111 mmol/L   CO2 29 22 - 32 mmol/L   Glucose, Bld 104 (H) 70 - 99 mg/dL   BUN 12 6 - 20 mg/dL   Creatinine, Ser 2.05 (H) 0.61 - 1.24 mg/dL   Calcium 8.4 (L) 8.9 - 10.3 mg/dL   Total Protein 5.2 (L) 6.5 - 8.1 g/dL   Albumin 2.8 (L) 3.5 - 5.0 g/dL   AST 30 15 - 41 U/L   ALT 24 0 - 44 U/L   Alkaline Phosphatase 36 (L) 38 - 126 U/L   Total Bilirubin 0.5 0.3 - 1.2 mg/dL   GFR, Estimated 46 (L) >60  mL/min   Anion gap 10 5 - 15  Magnesium  Result Value Ref Range   Magnesium 2.3 1.7 - 2.4 mg/dL  CBC  Result Value Ref Range   WBC 8.9 4.0 - 10.5 K/uL   RBC 3.92 (L) 4.22 - 5.81 MIL/uL   Hemoglobin 11.2 (L) 13.0 - 17.0 g/dL   HCT 33.6 (L) 39.0 - 52.0 %   MCV 85.7 80.0 - 100.0 fL   MCH 28.6 26.0 - 34.0 pg   MCHC 33.3 30.0 - 36.0 g/dL   RDW 13.3 11.5 - 15.5 %   Platelets 207 150 - 400 K/uL   nRBC 0.0 0.0 - 0.2 %  HIV Antibody (routine testing w rflx)  Result Value Ref Range   HIV Screen 4th Generation wRfx Non Reactive Non Reactive  CBG monitoring, ED  Result Value Ref Range   Glucose-Capillary 132 (H)  70 - 99 mg/dL

## 2022-03-14 ENCOUNTER — Other Ambulatory Visit: Payer: Medicaid Other

## 2022-03-21 ENCOUNTER — Other Ambulatory Visit (HOSPITAL_COMMUNITY)
Admission: RE | Admit: 2022-03-21 | Discharge: 2022-03-21 | Disposition: A | Discharge status: Home / Self Care | Payer: Medicaid Other | Source: Ambulatory Visit | Attending: Internal Medicine | Admitting: Internal Medicine

## 2022-03-21 ENCOUNTER — Other Ambulatory Visit (INDEPENDENT_AMBULATORY_CARE_PROVIDER_SITE_OTHER): Payer: Medicaid Other

## 2022-03-21 ENCOUNTER — Ambulatory Visit: Payer: Medicaid Other | Admitting: Internal Medicine

## 2022-03-21 DIAGNOSIS — Z0000 Encounter for general adult medical examination without abnormal findings: Secondary | ICD-10-CM | POA: Diagnosis not present

## 2022-03-21 DIAGNOSIS — N179 Acute kidney failure, unspecified: Secondary | ICD-10-CM

## 2022-03-21 DIAGNOSIS — R319 Hematuria, unspecified: Secondary | ICD-10-CM | POA: Diagnosis not present

## 2022-03-21 DIAGNOSIS — D649 Anemia, unspecified: Secondary | ICD-10-CM | POA: Diagnosis not present

## 2022-03-21 DIAGNOSIS — Z7251 High risk heterosexual behavior: Secondary | ICD-10-CM | POA: Insufficient documentation

## 2022-03-21 DIAGNOSIS — R770 Abnormality of albumin: Secondary | ICD-10-CM

## 2022-03-21 LAB — URINALYSIS, ROUTINE W REFLEX MICROSCOPIC
Hgb urine dipstick: NEGATIVE
Ketones, ur: NEGATIVE
Leukocytes,Ua: NEGATIVE
Nitrite: NEGATIVE
Specific Gravity, Urine: >=1.03 — AB (ref 1.000–1.030)
Total Protein, Urine: NEGATIVE
Urine Glucose: NEGATIVE
Urobilinogen, UA: 0.2 (ref 0.0–1.0)
pH: 6 (ref 5.0–8.0)

## 2022-03-21 LAB — LIPID PANEL
Cholesterol: 173 mg/dL (ref 0–200)
HDL: 39.5 mg/dL (ref >39.00)
LDL Cholesterol: 104 mg/dL — ABNORMAL HIGH (ref 0–99)
NonHDL: 133.24
Total CHOL/HDL Ratio: 4
Triglycerides: 145 mg/dL (ref 0.0–149.0)
VLDL: 29 mg/dL (ref 0.0–40.0)

## 2022-03-22 LAB — UA/M W/RFLX CULTURE, COMP

## 2022-03-22 LAB — CYTOLOGY, (ORAL, ANAL, URETHRAL) ANCILLARY ONLY
Chlamydia: NEGATIVE
Comment: NEGATIVE
Comment: NEGATIVE
Comment: NORMAL
Neisseria Gonorrhea: NEGATIVE
Trichomonas: NEGATIVE

## 2022-03-22 LAB — HIV ANTIBODY (ROUTINE TESTING W REFLEX): HIV 1&2 Ab, 4th Generation: NONREACTIVE

## 2022-03-22 LAB — HEPATITIS C ANTIBODY: Hepatitis C Ab: NONREACTIVE

## 2022-03-22 LAB — RPR: RPR Ser Ql: NONREACTIVE

## 2022-03-26 NOTE — Progress Notes (Signed)
See if he will come in and do repeat BMP.  Order it if he will.

## 2022-03-28 ENCOUNTER — Ambulatory Visit: Payer: Medicaid Other | Admitting: Internal Medicine

## 2022-03-28 ENCOUNTER — Encounter: Payer: Self-pay | Admitting: Internal Medicine

## 2022-03-28 VITALS — BP 124/72 | HR 80 | Temp 98.3°F | Ht 62.0 in | Wt 157.4 lb

## 2022-03-28 DIAGNOSIS — N179 Acute kidney failure, unspecified: Secondary | ICD-10-CM

## 2022-03-28 DIAGNOSIS — R770 Abnormality of albumin: Secondary | ICD-10-CM | POA: Diagnosis not present

## 2022-03-28 DIAGNOSIS — R110 Nausea: Secondary | ICD-10-CM | POA: Diagnosis not present

## 2022-03-28 DIAGNOSIS — N183 Chronic kidney disease, stage 3 unspecified: Secondary | ICD-10-CM | POA: Diagnosis not present

## 2022-03-28 DIAGNOSIS — R7303 Prediabetes: Secondary | ICD-10-CM | POA: Diagnosis not present

## 2022-03-28 DIAGNOSIS — Z8719 Personal history of other diseases of the digestive system: Secondary | ICD-10-CM | POA: Diagnosis not present

## 2022-03-28 MED ORDER — ONDANSETRON 4 MG PO TBDP: 4.0000 mg | ORAL_TABLET | Freq: Three times a day (TID) | ORAL | 4 refills | Status: AC | PRN

## 2022-03-28 MED ORDER — EMPAGLIFLOZIN 10 MG PO TABS: 10.0000 mg | ORAL_TABLET | Freq: Every day | ORAL | 3 refills | Status: AC

## 2022-03-28 NOTE — Progress Notes (Signed)
[name]: [phone]   Routine Medical Office Visit  Patient:  Garrett Cook      Age: 22 y.o.       Sex:  male  Date:   03/28/2022  PCP:    Loralee Pacas, MD   [place] Provider: Loralee Pacas, MD   Problem Focused Charting:   Medical Decision Making per Assessment/Plan   Teague was seen today for acute kidney injury.  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 White blood cells count Had no stones on CT kidney 01/2022 but did have hematuria, hypotension, and lactic acidosis at time of aki. So this was presumed septic shock induced kidney injury that resolved. Since hospital no n/v/D Lab Results  Component Value Date/Time   CREATININE 2.05 (H) 01/31/2022 07:15 AM   CREATININE 2.11 (H) 01/30/2022 05:27 PM   CREATININE 2.27 (H) 01/30/2022 04:14 AM   CREATININE 0.73 01/30/2022 03:22 AM   CREATININE 2.57 (H) 01/29/2022 11:35 PM   CREATININE 0.92 11/02/2014 12:20 PM       Assessment & Plan: I advised him that he needs to not take any ibuprofen or Aleve and stay hydrated for the foreseeable future and reviewed all of the emergency room stuff again with him and asked how he is doing with urination which it is all been good and he is not having any nausea vomiting so I think this will probably resolve but it does need to be continued to be monitored and if it is not improving we might even have to follow with a kidney specialist in the future  Continue with monitoring BMP- predict slow improvement - follow up up 3-6 month(s) for next repeat blood and check today too.  Orders: -     CBC; Future -     COMPLETE METABOLIC PANEL WITH GFR; Future -     Magnesium; Future -     Phosphorus; Future -     VITAMIN D 25 Hydroxy (Vit-D Deficiency, Fractures); Future -     Urinalysis, Complete; Future  History of colitis Overview: This was attributed to chicken dip November 2023    Assessment & Plan:  I advised patient that he needs to keep an eye out for any nausea and vomiting in the future because if he develops that he needs to go to a GI specialist to make sure it is not Crohn's disease.  However I think chicken dip probably is to blame due to the nausea and vomiting was just for a day or 2 and then quickly went away   Suspect this due to reheating double dipped buffalo sauce- advised against reheating food that's been double dipped.   Low serum albumin Overview: Lab Results  Component Value Date   LABPROT 14.5 01/30/2022     Assessment & Plan: albumin is the main protein that binds things on the blood and I am not sure why it went so low for him perhaps due to malnutrition or sepsis at the time but organ to recheck it today  Suspect this was leakage of alb from acute kidney injury   Orders: -     Microalbumin / creatinine urine ratio; Future  Nausea Assessment & Plan: He does not really have any nausea anymore but due to he got very severely dehydrated and injured his kidneys I advised him to take Zofran at the first signs of nausea in the future hopefully avoid the emergency room  because if he starts vomiting he needs to go to the emergency room from now on due to this serious November 2023 hospitalization with kidney injury  Orders: -     Ondansetron; Take 1 tablet (4 mg total) by mouth every 8 (eight) hours as needed for nausea or vomiting (for nausea from wegovy or other source).  Dispense: 20 tablet; Refill: 4  Stage 3 chronic kidney disease, unspecified whether stage 3a or 3b CKD (HCC) Assessment & Plan: Might not be chronic kidney disease but will plan to treat as such: chronic kidney disease management plan:  Preventing Progression Add ACEi/ARB with the goal to reduce UACR to < 300 and BP 130/80 - check microalb today SGLT2-inhibitor if GFR >20 - add jardiance today Avoid NSAIDs, PPI, contrasted studies, aminoglycosides, acyclovir,  phosphate-based bowel prep, baclofen - explained NSAID risks Renally dose medications including anti-infectives, atenolol, colchicine, DOACs, diabetes medications, gabapentin, levetiracetam, metoclopramide, opioids Diabetes management: goal A1c <7%; metformin if GFR>45; SGLT2i if GFR >20, GLP-1 receptor agonists if GFR >30; Insulin safe at any GFR  - checking A1c again due to history of prediabetes years ago. Lifestyle and Nutrition  Exercise Smoking Cessation  Monitoring: Repeating creatinine  to confirm a consistent decrease in GFR over a 40-month period Send CBC, calcium, phosphorus, A1c, urinalysis, albumin: creatinine ratio, protein: creatinine ratio, PTH, vitamin D Renal ultrasound not necessary in my medical opinion due to clear colitis-> septic shock induced and CT done 01/2022 saw no stones (and hematuria from to that time resolved Consider Spep and upep... but not worth in my medical opinion  Consider referral to renal consultant (if 3b or rapid worsening) for assistance with management and to discuss renal replacement therapy and transplant  Management of Complications IV iron if deficient; will consider if CBC shows persistent anemia. Sodium bicarbonate 650-1300mg  TID for goal HCO3 >22 [place]  TID with meals for goal phosphorus <5.5 Diuretics PRN or standing for edema - but he never had Management of Comorbidities and Risk Reduction - encouraged exercise and avoid smoking.  Orders: -     Empagliflozin; Take 1 tablet (10 mg total) by mouth daily before breakfast.  Dispense: 90 tablet; Refill: 3 -     Microalbumin / creatinine urine ratio; Future  Prediabetes Overview: Lab Results  Component Value Date   HGBA1C 5.8 (H) 11/02/2014     Assessment & Plan: Check a1c  Orders: -     Hemoglobin A1c; Future    Today's key discussion points and After Visit Summary (AVS) reminders. Problem-associated medical records were reviewed with @ during the  appointment Common side effects, risks, benefits, and alternatives for medications and treatment plan prescribed today were discussed    Subjective - Clinical Presentation:   Garrett Cook is a 22 y.o. male  Patient Active Problem List   Diagnosis Date Noted   Nausea 03/28/2022   Prediabetes 03/28/2022   Stage 3 chronic kidney disease (HCC) 03/28/2022   Anemia 03/08/2022   Low serum albumin 03/08/2022   AKI (acute kidney injury) (HCC) 01/30/2022   History of colitis 01/30/2022   Eczema 02/22/2016   Seasonal allergic rhinitis 11/02/2014   Failed vision screen, vision 20/20 with glasses but cannot wear glasses due to fogging for football, vision without glasses is 20/70 11/02/2014   Acne 11/02/2014   Past Medical History:  Diagnosis Date   Acne    Allergy    Eczema    Hematuria 03/08/2022   Leg fracture     No outpatient medications  prior to visit.   No facility-administered medications prior to visit.    Chief Complaint  Patient presents with   acute kidney injury    HPI   Patient denies any further n/v since hospitalized Patient reports feeling well, going to gym Reviewed recent labs with patient-all negative but we didn't get everything we meant to get and needed- he is not upset about it. Patient denies urinary symptoms but urine output is only 3x urination daily        Objective:  Physical Exam  BP 124/72 (BP Location: Right Arm, Patient Position: Sitting)   Pulse 80   Temp 98.3 F (36.8 C) (Temporal)   Ht 5\' 2"  (1.575 m)   Wt 157 lb 6.4 oz (71.4 kg)   SpO2 100%   BMI 28.79 kg/m   Overweight  by BMI criteria but truncal adiposity (waist circumference or caliper) should be used instead. Wt Readings from Last 10 Encounters:  03/28/22 157 lb 6.4 oz (71.4 kg)  03/08/22 155 lb 9.6 oz (70.6 kg)  01/30/22 150 lb (68 kg)  05/27/19 160 lb (72.6 kg)  01/13/19 160 lb (72.6 kg)  08/21/16 157 lb (71.2 kg) (65 %, Z= 0.39)*  10/04/15 156 lb 6.4 oz (70.9 kg) (72 %,  Z= 0.57)*  05/06/15 149 lb 11.2 oz (67.9 kg) (67 %, Z= 0.45)*  11/22/14 150 lb 9.2 oz (68.3 kg) (74 %, Z= 0.63)*  11/02/14 151 lb 6.4 oz (68.7 kg) (75 %, Z= 0.68)*   * Growth percentiles are based on CDC (Boys, 2-20 Years) data.   Vital signs reviewed.  Nursing notes reviewed. Weight trend reviewed. General Appearance:  Well developed, well nourished male in no acute distress.   Normal work of breathing at rest Musculoskeletal: All extremities are intact.  Neurological:  Awake, alert,  No obvious focal neurological deficits or cognitive impairments Psychiatric:  Appropriate mood, pleasant demeanor Problem-specific findings:  oral mucosa moist, well appearing   Results Reviewed: No results found for any visits on 03/28/22.  Recent Results (from the past 2160 hour(s))  CBC with Differential     Status: Abnormal   Collection Time: 01/29/22 11:35 PM  Result Value Ref Range   WBC 9.4 4.0 - 10.5 K/uL   RBC 5.56 4.22 - 5.81 MIL/uL   Hemoglobin 15.9 13.0 - 17.0 g/dL   HCT 16.146.8 09.639.0 - 04.552.0 %   MCV 84.2 80.0 - 100.0 fL   MCH 28.6 26.0 - 34.0 pg   MCHC 34.0 30.0 - 36.0 g/dL   RDW 40.912.9 81.111.5 - 91.415.5 %   Platelets 272 150 - 400 K/uL   nRBC 0.0 0.0 - 0.2 %   Neutrophils Relative % 84 %   Neutro Abs 7.9 (H) 1.7 - 7.7 K/uL   Lymphocytes Relative 11 %   Lymphs Abs 1.0 0.7 - 4.0 K/uL   Monocytes Relative 4 %   Monocytes Absolute 0.4 0.1 - 1.0 K/uL   Eosinophils Relative 0 %   Eosinophils Absolute 0.0 0.0 - 0.5 K/uL   Basophils Relative 0 %   Basophils Absolute 0.0 0.0 - 0.1 K/uL   Immature Granulocytes 1 %   Abs Immature Granulocytes 0.12 (H) 0.00 - 0.07 K/uL    Comment: Performed at [facility], [address] [place], [place] [address]  Comprehensive metabolic panel     Status: Abnormal   Collection Time: 01/29/22 11:35 PM  Result Value Ref Range   Sodium 133 (L) 135 - 145 mmol/L   Potassium  3.8 3.5 - 5.1 mmol/L   Chloride 95 (L) 98 - 111 mmol/L   CO2 25 22 - 32 mmol/L    Glucose, Bld 150 (H) 70 - 99 mg/dL    Comment: Glucose reference range applies only to samples taken after fasting for at least 8 hours.   BUN 23 (H) 6 - 20 mg/dL   Creatinine, Ser 3.292.57 (H) 0.61 - 1.24 mg/dL   Calcium 9.3 8.9 - 51.810.3 mg/dL   Total Protein 8.1 6.5 - 8.1 g/dL   Albumin 4.2 3.5 - 5.0 g/dL   AST 41 15 - 41 U/L   ALT 37 0 - 44 U/L   Alkaline Phosphatase 50 38 - 126 U/L   Total Bilirubin 1.3 (H) 0.3 - 1.2 mg/dL   GFR, Estimated 35 (L) >60 mL/min    Comment: (NOTE) Calculated using the CKD-EPI Creatinine Equation (2021)    Anion gap 13 5 - 15    Comment: Performed at [facility], [address] [place], [place] [address]  Lipase, blood     Status: None   Collection Time: 01/29/22 11:35 PM  Result Value Ref Range   Lipase 32 11 - 51 U/L    Comment: Performed at [facility], [address] [place], [place] [address]  Lactic acid, plasma     Status: Abnormal   Collection Time: 01/29/22 11:35 PM  Result Value Ref Range   Lactic Acid, Venous 2.6 (HH) 0.5 - 1.9 mmol/L    Comment: CRITICAL RESULT CALLED TO, READ BACK BY AND VERIFIED WITH CRYSTAL BLACK RN 01/30/22 0030 Enid DerryM KOROLESKI Performed at [facility], [address] [place], [place] [address]   CK     Status: None   Collection Time: 01/29/22 11:35 PM  Result Value Ref Range   Total CK 134 49 - 397 U/L    Comment: Performed at [facility], [address] [place], [place] [address]  Resp Panel by RT-PCR (Flu A&B, Covid) Anterior Nasal Swab     Status: None   Collection Time: 01/29/22 11:45 PM   Specimen: Anterior Nasal Swab  Result Value Ref Range   SARS Coronavirus 2 by RT PCR NEGATIVE NEGATIVE    Comment: (NOTE) SARS-CoV-2 target nucleic acids are NOT DETECTED.  The SARS-CoV-2 RNA is generally detectable in upper respiratory specimens during the acute phase of infection. The lowest concentration of SARS-CoV-2 viral copies this assay can detect is 138 copies/mL. A negative result does  not preclude SARS-Cov-2 infection and should not be used as the sole basis for treatment or other patient management decisions. A negative result may occur with  improper specimen collection/handling, submission of specimen other than nasopharyngeal swab, presence of viral mutation(s) within the areas targeted by this assay, and inadequate number of viral copies(<138 copies/mL). A negative result must be combined with clinical observations, patient history, and epidemiological information. The expected result is Negative.  Fact Sheet for Patients:  BloggerCourse.comhttps://www.fda.gov/media/152166/download  Fact Sheet for Healthcare Providers:  SeriousBroker.ithttps://www.fda.gov/media/152162/download  This test is no t yet approved or cleared by the [place] FDA and  has been authorized for detection and/or diagnosis of SARS-CoV-2 by FDA under an Emergency Use Authorization (EUA). This EUA will remain  in effect (meaning this test can be used) for the duration of the COVID-19 declaration under Section 564(b)(1) of the Act, 21 U.S.C.section 360bbb-3(b)(1), unless the authorization is terminated  or revoked sooner.       Influenza A by  PCR NEGATIVE NEGATIVE   Influenza B by PCR NEGATIVE NEGATIVE    Comment: (NOTE) The Xpert Xpress SARS-CoV-2/FLU/RSV plus assay is intended as an aid in the diagnosis of influenza from Nasopharyngeal swab specimens and should not be used as a sole basis for treatment. Nasal washings and aspirates are unacceptable for Xpert Xpress SARS-CoV-2/FLU/RSV testing.  Fact Sheet for Patients: BloggerCourse.com  Fact Sheet for Healthcare Providers: SeriousBroker.it  This test is not yet approved or cleared by the [place] FDA and has been authorized for detection and/or diagnosis of SARS-CoV-2 by FDA under an Emergency Use Authorization (EUA). This EUA will remain in effect (meaning this test can be used) for the duration of  the COVID-19 declaration under Section 564(b)(1) of the Act, 21 U.S.C. section 360bbb-3(b)(1), unless the authorization is terminated or revoked.  Performed at [facility], [address] [place], [place] [address]   Blood culture (routine x 2)     Status: None   Collection Time: 01/30/22  1:30 AM   Specimen: BLOOD  Result Value Ref Range   Specimen Description BLOOD RIGHT ANTECUBITAL    Special Requests      BOTTLES DRAWN AEROBIC AND ANAEROBIC Blood Culture adequate volume   Culture      NO GROWTH 5 DAYS Performed at [facility], [address], [place], [place] [address]    Report Status 02/04/2022 FINAL   Lactic acid, plasma     Status: None   Collection Time: 01/30/22  1:30 AM  Result Value Ref Range   Lactic Acid, Venous 1.6 0.5 - 1.9 mmol/L    Comment: Performed at [facility], [address], [place], [place] [address]  Protime-INR     Status: None   Collection Time: 01/30/22  1:30 AM  Result Value Ref Range   Prothrombin Time 14.5 11.4 - 15.2 seconds   INR 1.1 0.8 - 1.2    Comment: (NOTE) INR goal varies based on device and disease states. Performed at [facility], [address] [place], [place] [address]   APTT     Status: None   Collection Time: 01/30/22  1:30 AM  Result Value Ref Range   aPTT 33 24 - 36 seconds    Comment: Performed at [facility], [address], [place], [place] [address]  Blood culture (routine x 2)     Status: None   Collection Time: 01/30/22  1:32 AM   Specimen: BLOOD RIGHT FOREARM  Result Value Ref Range   Specimen Description BLOOD RIGHT FOREARM    Special Requests      BOTTLES DRAWN AEROBIC AND ANAEROBIC Blood Culture adequate volume   Culture      NO GROWTH 5 DAYS Performed at [facility], [address] [place], [place] [address]    Report Status 02/04/2022 FINAL   Comprehensive metabolic panel     Status: Abnormal   Collection Time: 01/30/22  3:22 AM  Result Value Ref Range    Sodium 131 (L) 135 - 145 mmol/L   Potassium 3.9 3.5 - 5.1 mmol/L   Chloride 106 98 - 111 mmol/L   CO2 10 (L) 22 - 32 mmol/L   Glucose, Bld 57 (L) 70 - 99 mg/dL    Comment: Glucose reference range applies only to samples taken after fasting for at least 8 hours.   BUN 9 6 - 20 mg/dL   Creatinine, Ser 6.71 0.61 - 1.24 mg/dL  Calcium 6.4 (LL) 8.9 - 10.3 mg/dL    Comment: CRITICAL RESULT CALLED TO, READ BACK BY AND VERIFIED WITH LIZBETH CABRERA RN 01/30/22 0402 M KOROLESKI   Total Protein <3.0 (L) 6.5 - 8.1 g/dL   Albumin <4.0 (L) 3.5 - 5.0 g/dL   AST 11 (L) 15 - 41 U/L   ALT 8 0 - 44 U/L   Alkaline Phosphatase 9 (L) 38 - 126 U/L   Total Bilirubin 0.4 0.3 - 1.2 mg/dL   GFR, Estimated >98 >11 mL/min    Comment: (NOTE) Calculated using the CKD-EPI Creatinine Equation (2021)    Anion gap 15 5 - 15    Comment: Performed at [facility], [address], [place], [place] [address]  Magnesium     Status: Abnormal   Collection Time: 01/30/22  3:22 AM  Result Value Ref Range   Magnesium <0.5 (LL) 1.7 - 2.4 mg/dL    Comment: CRITICAL RESULT CALLED TO, READ BACK BY AND VERIFIED WITH: L.CABRERA RN @ 0402 01/30/22 BY M.KOROLESKI Performed at [facility], [address], [place], [place] [address]   CBG monitoring, ED     Status: Abnormal   Collection Time: 01/30/22  4:07 AM  Result Value Ref Range   Glucose-Capillary 132 (H) 70 - 99 mg/dL    Comment: Glucose reference range applies only to samples taken after fasting for at least 8 hours.  Comprehensive metabolic panel     Status: Abnormal   Collection Time: 01/30/22  4:14 AM  Result Value Ref Range   Sodium 136 135 - 145 mmol/L   Potassium 4.1 3.5 - 5.1 mmol/L   Chloride 99 98 - 111 mmol/L   CO2 24 22 - 32 mmol/L   Glucose, Bld 136 (H) 70 - 99 mg/dL    Comment: Glucose reference range applies only to samples taken after fasting for at least 8 hours.   BUN 23 (H) 6 - 20 mg/dL   Creatinine, Ser 1.30 (H) 0.61 - 1.24 mg/dL     Comment: RESULTS VERIFIED BY REPEAT TESTING   Calcium 8.1 (L) 8.9 - 10.3 mg/dL   Total Protein 5.5 (L) 6.5 - 8.1 g/dL   Albumin 2.9 (L) 3.5 - 5.0 g/dL   AST 31 15 - 41 U/L   ALT 26 0 - 44 U/L   Alkaline Phosphatase 34 (L) 38 - 126 U/L   Total Bilirubin 0.8 0.3 - 1.2 mg/dL   GFR, Estimated 41 (L) >60 mL/min    Comment: (NOTE) Calculated using the CKD-EPI Creatinine Equation (2021)    Anion gap 13 5 - 15    Comment: Performed at [facility], [address], [place], [place] [address]  Magnesium     Status: Abnormal   Collection Time: 01/30/22  4:14 AM  Result Value Ref Range   Magnesium 1.5 (L) 1.7 - 2.4 mg/dL    Comment: Performed at [facility], [address] [place], [place] [address]  CBC with Differential/Platelet     Status: Abnormal   Collection Time: 01/30/22  4:39 AM  Result Value Ref Range   WBC 8.0 4.0 - 10.5 K/uL   RBC 4.06 (L) 4.22 - 5.81 MIL/uL   Hemoglobin 11.6 (L) 13.0 - 17.0 g/dL    Comment: REPEATED TO VERIFY   HCT 34.6 (L) 39.0 - 52.0 %   MCV 85.2 80.0 - 100.0 fL   MCH 28.6 26.0 - 34.0 pg   MCHC 33.5 30.0 -  36.0 g/dL   RDW 16.112.8 09.611.5 - 04.515.5 %   Platelets 202 150 - 400 K/uL    Comment: REPEATED TO VERIFY   nRBC 0.0 0.0 - 0.2 %   Neutrophils Relative % 80 %   Neutro Abs 6.4 1.7 - 7.7 K/uL   Lymphocytes Relative 13 %   Lymphs Abs 1.0 0.7 - 4.0 K/uL   Monocytes Relative 6 %   Monocytes Absolute 0.5 0.1 - 1.0 K/uL   Eosinophils Relative 0 %   Eosinophils Absolute 0.0 0.0 - 0.5 K/uL   Basophils Relative 0 %   Basophils Absolute 0.0 0.0 - 0.1 K/uL   Immature Granulocytes 1 %   Abs Immature Granulocytes 0.07 0.00 - 0.07 K/uL    Comment: Performed at [facility], [address] [place], [place] [address]  Comprehensive metabolic panel     Status: Abnormal   Collection Time: 01/30/22  5:27 PM  Result Value Ref Range   Sodium 137 135 - 145 mmol/L   Potassium 4.0 3.5 - 5.1 mmol/L   Chloride 104 98 - 111 mmol/L   CO2 25 22 - 32 mmol/L    Glucose, Bld 113 (H) 70 - 99 mg/dL    Comment: Glucose reference range applies only to samples taken after fasting for at least 8 hours.   BUN 17 6 - 20 mg/dL   Creatinine, Ser 1.912.11 (H) 0.61 - 1.24 mg/dL   Calcium 8.3 (L) 8.9 - 10.3 mg/dL   Total Protein 5.4 (L) 6.5 - 8.1 g/dL   Albumin 2.9 (L) 3.5 - 5.0 g/dL   AST 32 15 - 41 U/L   ALT 23 0 - 44 U/L   Alkaline Phosphatase 36 (L) 38 - 126 U/L   Total Bilirubin 0.5 0.3 - 1.2 mg/dL   GFR, Estimated 44 (L) >60 mL/min    Comment: (NOTE) Calculated using the CKD-EPI Creatinine Equation (2021)    Anion gap 8 5 - 15    Comment: Performed at [facility], [address] [place], [place] [address]  Urinalysis, Routine w reflex microscopic Urine, Clean Catch     Status: Abnormal   Collection Time: 01/30/22  9:47 PM  Result Value Ref Range   Color, Urine STRAW (A) YELLOW   APPearance CLEAR CLEAR   Specific Gravity, Urine 1.005 1.005 - 1.030   pH 6.0 5.0 - 8.0   Glucose, UA NEGATIVE NEGATIVE mg/dL   Hgb urine dipstick MODERATE (A) NEGATIVE   Bilirubin Urine NEGATIVE NEGATIVE   Ketones, ur NEGATIVE NEGATIVE mg/dL   Protein, ur NEGATIVE NEGATIVE mg/dL   Nitrite NEGATIVE NEGATIVE   Leukocytes,Ua NEGATIVE NEGATIVE   RBC / HPF 0-5 0 - 5 RBC/hpf   WBC, UA 0-5 0 - 5 WBC/hpf   Bacteria, UA NONE SEEN NONE SEEN    Comment: Performed at [facility], [address] [place], [place] [address]  Urine Culture     Status: None   Collection Time: 01/30/22  9:47 PM   Specimen: In/Out Cath Urine  Result Value Ref Range   Specimen Description IN/OUT CATH URINE    Special Requests Normal    Culture      NO GROWTH Performed at [facility], [address] [place], [place] [address]    Report Status 02/01/2022 FINAL   Comprehensive metabolic panel     Status: Abnormal   Collection Time: 01/31/22  7:15 AM  Result Value Ref Range   Sodium 140 135 -  145 mmol/L   Potassium 3.8 3.5 - 5.1 mmol/L   Chloride 101 98 - 111 mmol/L   CO2 29  22 - 32 mmol/L   Glucose, Bld 104 (H) 70 - 99 mg/dL    Comment: Glucose reference range applies only to samples taken after fasting for at least 8 hours.   BUN 12 6 - 20 mg/dL   Creatinine, Ser 9.67 (H) 0.61 - 1.24 mg/dL   Calcium 8.4 (L) 8.9 - 10.3 mg/dL   Total Protein 5.2 (L) 6.5 - 8.1 g/dL   Albumin 2.8 (L) 3.5 - 5.0 g/dL   AST 30 15 - 41 U/L   ALT 24 0 - 44 U/L   Alkaline Phosphatase 36 (L) 38 - 126 U/L   Total Bilirubin 0.5 0.3 - 1.2 mg/dL   GFR, Estimated 46 (L) >60 mL/min    Comment: (NOTE) Calculated using the CKD-EPI Creatinine Equation (2021)    Anion gap 10 5 - 15    Comment: Performed at [facility], [address] [place], [place] [address]  Magnesium     Status: None   Collection Time: 01/31/22  7:15 AM  Result Value Ref Range   Magnesium 2.3 1.7 - 2.4 mg/dL    Comment: Performed at [facility], [address], [place], [place] [address]  CBC     Status: Abnormal   Collection Time: 01/31/22  7:15 AM  Result Value Ref Range   WBC 8.9 4.0 - 10.5 K/uL   RBC 3.92 (L) 4.22 - 5.81 MIL/uL   Hemoglobin 11.2 (L) 13.0 - 17.0 g/dL   HCT 02.5 (L) 85.2 - 77.8 %   MCV 85.7 80.0 - 100.0 fL   MCH 28.6 26.0 - 34.0 pg   MCHC 33.3 30.0 - 36.0 g/dL   RDW 24.2 35.3 - 61.4 %   Platelets 207 150 - 400 K/uL   nRBC 0.0 0.0 - 0.2 %    Comment: Performed at [facility], [address] [place], [place] [address]  HIV Antibody (routine testing w rflx)     Status: None   Collection Time: 01/31/22  7:15 AM  Result Value Ref Range   HIV Screen 4th Generation wRfx Non Reactive Non Reactive    Comment: Performed at [facility], [address] [place], [place] [address]  UA/M w/rflx Culture, Comp     Status: None   Collection Time: 03/21/22 11:23 AM   Specimen: Urine   Urine  Result Value Ref Range   Specific Gravity, UA CANCELED     Comment: Test not performed. A Boric acid preserved urine is not suitable for urinalysis.  Result canceled by the  ancillary.    pH, UA CANCELED     Comment: Test not performed  Result canceled by the ancillary.    Protein,UA CANCELED     Comment: Test not performed  Result canceled by the ancillary.    Glucose, UA CANCELED     Comment: Test not performed  Result canceled by the ancillary.    Ketones, UA CANCELED     Comment: Test not performed  Result canceled by the ancillary.   Urinalysis, Routine w reflex microscopic     Status: Abnormal   Collection Time: 03/21/22 11:23 AM  Result Value Ref Range   Color, Urine YELLOW Yellow;Lt. Yellow;Straw;Dark Yellow;Amber;Green;Red;Brown   APPearance CLEAR Clear;Turbid;Slightly Cloudy;Cloudy   Specific Gravity, Urine >=1.030 (A) 1.000 - 1.030   pH 6.0 5.0 - 8.0  Total Protein, Urine NEGATIVE Negative   Urine Glucose NEGATIVE Negative   Ketones, ur NEGATIVE Negative   Bilirubin Urine SMALL (A) Negative   Hgb urine dipstick NEGATIVE Negative   Urobilinogen, UA 0.2 0.0 - 1.0   Leukocytes,Ua NEGATIVE Negative   Nitrite NEGATIVE Negative   WBC, UA 0-2/hpf 0-2/hpf   RBC / HPF 0-2/hpf 0-2/hpf   Mucus, UA Presence of (A) None   Squamous Epithelial / HPF Rare(0-4/hpf) Rare(0-4/hpf)  Lipid panel     Status: Abnormal   Collection Time: 03/21/22 11:23 AM  Result Value Ref Range   Cholesterol 173 0 - 200 mg/dL    Comment: ATP III Classification       Desirable:  < 200 mg/dL               Borderline High:  200 - 239 mg/dL          High:  > = 240 mg/dL   Triglycerides 145.0 0.0 - 149.0 mg/dL    Comment: Normal:  <150 mg/dLBorderline High:  150 - 199 mg/dL   HDL 39.50 >39.00 mg/dL   VLDL 29.0 0.0 - 40.0 mg/dL   LDL Cholesterol 104 (H) 0 - 99 mg/dL   Total CHOL/HDL Ratio 4     Comment:                Men          Women1/2 Average Risk     3.4          3.3Average Risk          5.0          4.42X Average Risk          9.6          7.13X Average Risk          15.0          11.0                       NonHDL 133.24     Comment: NOTE:  Non-HDL goal should  be 30 mg/dL higher than patient's LDL goal (i.e. LDL goal of < 70 mg/dL, would have non-HDL goal of < 100 mg/dL)  RPR     Status: None   Collection Time: 03/21/22 11:23 AM  Result Value Ref Range   RPR Ser Ql NON-REACTIVE NON-REACTIVE    Comment: . No laboratory evidence of syphilis. If recent exposure is suspected, submit a new sample in 2-4 weeks. .   Hepatitis C Antibody     Status: None   Collection Time: 03/21/22 11:23 AM  Result Value Ref Range   Hepatitis C Ab NON-REACTIVE NON-REACTIVE    Comment: . HCV antibody was non-reactive. There is no laboratory  evidence of HCV infection. . In most cases, no further action is required. However, if recent HCV exposure is suspected, a test for HCV RNA (test code [phone]) is suggested. . For additional information please refer to http://education.[facility].com/faq/FAQ22v1 (This link is being provided for informational/ educational purposes only.) .   HIV antibody (with reflex)     Status: None   Collection Time: 03/21/22 11:23 AM  Result Value Ref Range   HIV 1&2 Ab, 4th Generation NON-REACTIVE NON-REACTIVE    Comment: HIV-1 antigen and HIV-1/HIV-2 antibodies were not detected. There is no laboratory evidence of HIV infection. Marland Kitchen PLEASE NOTE: This information has been disclosed to you from records whose confidentiality may be protected by state law.  If your state requires such protection, then the state law prohibits you from making any further disclosure of the information without the specific written consent of the person to whom it pertains, or as otherwise permitted by law. A general authorization for the release of medical or other information is NOT sufficient for this purpose. . For additional information please refer to http://education.[facility].com/faq/FAQ106 (This link is being provided for informational/ educational purposes only.) . Marland Kitchen The performance of this assay has not been  clinically validated in patients less than 69 years old. .   Cytology (oral, anal, urethral) ancillary only     Status: None   Collection Time: 03/21/22 11:23 AM  Result Value Ref Range   Neisseria Gonorrhea Negative    Chlamydia Negative    Trichomonas Negative    Comment Normal Reference Range Trichomonas - Negative    Comment Normal Reference Ranger Chlamydia - Negative    Comment      Normal Reference Range Neisseria Gonorrhea - Negative          Signed: Lula Olszewski, MD 03/28/2022 1:04 PM

## 2022-03-28 NOTE — Assessment & Plan Note (Addendum)
I advised him that he needs to not take any ibuprofen or Aleve and stay hydrated for the foreseeable future and reviewed all of the emergency room stuff again with him and asked how he is doing with urination which it is all been good and he is not having any nausea vomiting so I think this will probably resolve but it does need to be continued to be monitored and if it is not improving we might even have to follow with a kidney specialist in the future  Continue with monitoring BMP- predict slow improvement - follow up up 3-6 month(s) for next repeat blood and check today too.

## 2022-03-28 NOTE — Assessment & Plan Note (Signed)
He does not really have any nausea anymore but due to he got very severely dehydrated and injured his kidneys I advised him to take Zofran at the first signs of nausea in the future hopefully avoid the emergency room because if he starts vomiting he needs to go to the emergency room from now on due to this serious November 2023 hospitalization with kidney injury

## 2022-03-28 NOTE — Assessment & Plan Note (Addendum)
Might not be chronic kidney disease but will plan to treat as such: chronic kidney disease management plan:  Preventing Progression Add ACEi/ARB with the goal to reduce UACR to < 300 and BP 130/80 - check microalb today SGLT2-inhibitor if GFR >20 - add jardiance today Avoid NSAIDs, PPI, contrasted studies, aminoglycosides, acyclovir, phosphate-based bowel prep, baclofen - explained NSAID risks Renally dose medications including anti-infectives, atenolol, colchicine, DOACs, diabetes medications, gabapentin, levetiracetam, metoclopramide, opioids Diabetes management: goal A1c <7%; metformin if GFR>45; SGLT2i if GFR >20, GLP-1 receptor agonists if GFR >30; Insulin safe at any GFR  - checking A1c again due to history of prediabetes years ago. Lifestyle and Nutrition  Exercise Smoking Cessation  Monitoring: Repeating creatinine  to confirm a consistent decrease in GFR over a 40-month period Send CBC, calcium, phosphorus, A1c, urinalysis, albumin: creatinine ratio, protein: creatinine ratio, PTH, vitamin D Renal ultrasound not necessary in my medical opinion due to clear colitis-> septic shock induced and CT done 01/2022 saw no stones (and hematuria from to that time resolved Consider Spep and upep... but not worth in my medical opinion  Consider referral to renal consultant (if 3b or rapid worsening) for assistance with management and to discuss renal replacement therapy and transplant  Management of Complications IV iron if deficient; will consider if CBC shows persistent anemia. Sodium bicarbonate 650-1300mg  TID for goal HCO3 >22 Sevelamer 800mg  TID with meals for goal phosphorus <5.5 Diuretics PRN or standing for edema - but he never had Management of Comorbidities and Risk Reduction - encouraged exercise and avoid smoking.

## 2022-03-28 NOTE — Assessment & Plan Note (Addendum)
albumin is the main protein that binds things on the blood and I am not sure why it went so low for him perhaps due to malnutrition or sepsis at the time but organ to recheck it today  Suspect this was leakage of alb from acute kidney injury

## 2022-03-28 NOTE — Assessment & Plan Note (Addendum)
I advised patient that he needs to keep an eye out for any nausea and vomiting in the future because if he develops that he needs to go to a GI specialist to make sure it is not Crohn's disease.  However I think chicken dip probably is to blame due to the nausea and vomiting was just for a day or 2 and then quickly went away   Suspect this due to reheating double dipped buffalo sauce- advised against reheating food that's been double dipped.

## 2022-03-28 NOTE — Assessment & Plan Note (Signed)
Check a1c 

## 2022-03-28 NOTE — Patient Instructions (Addendum)
Do NOT take ibuprofen/aleve

## 2022-03-29 LAB — URINALYSIS, COMPLETE
Bacteria, UA: NONE SEEN /HPF
Bilirubin Urine: NEGATIVE
Glucose, UA: NEGATIVE
Hgb urine dipstick: NEGATIVE
Hyaline Cast: NONE SEEN /LPF
Ketones, ur: NEGATIVE
Leukocytes,Ua: NEGATIVE
Nitrite: NEGATIVE
RBC / HPF: NONE SEEN /HPF (ref 0–2)
Specific Gravity, Urine: 1.036 — ABNORMAL HIGH (ref 1.001–1.035)
Squamous Epithelial / HPF: NONE SEEN /HPF (ref <=5)
WBC, UA: NONE SEEN /HPF (ref 0–5)
pH: 5.5 (ref 5.0–8.0)

## 2022-03-29 LAB — CBC
HCT: 36.9 % — ABNORMAL LOW (ref 38.5–50.0)
Hemoglobin: 12.8 g/dL — ABNORMAL LOW (ref 13.2–17.1)
MCH: 28.5 pg (ref 27.0–33.0)
MCHC: 34.7 g/dL (ref 32.0–36.0)
MCV: 82.2 fL (ref 80.0–100.0)
MPV: 9.3 fL (ref 7.5–12.5)
Platelets: 358 10*3/uL (ref 140–400)
RBC: 4.49 10*6/uL (ref 4.20–5.80)
RDW: 13.2 % (ref 11.0–15.0)
WBC: 5.5 10*3/uL (ref 3.8–10.8)

## 2022-03-29 LAB — HEMOGLOBIN A1C
Hgb A1c MFr Bld: 5.6 % of total Hgb (ref <5.7)
Mean Plasma Glucose: 114 mg/dL
eAG (mmol/L): 6.3 mmol/L

## 2022-03-29 LAB — COMPLETE METABOLIC PANEL WITH GFR
AG Ratio: 1.8 (calc) (ref 1.0–2.5)
ALT: 44 U/L (ref 9–46)
AST: 75 U/L — ABNORMAL HIGH (ref 10–40)
Albumin: 4.8 g/dL (ref 3.6–5.1)
Alkaline phosphatase (APISO): 75 U/L (ref 36–130)
BUN/Creatinine Ratio: 12 (calc) (ref 6–22)
BUN: 16 mg/dL (ref 7–25)
CO2: 30 mmol/L (ref 20–32)
Calcium: 9.9 mg/dL (ref 8.6–10.3)
Chloride: 103 mmol/L (ref 98–110)
Creat: 1.37 mg/dL — ABNORMAL HIGH (ref 0.60–1.24)
Globulin: 2.6 g/dL (calc) (ref 1.9–3.7)
Glucose, Bld: 100 mg/dL — ABNORMAL HIGH (ref 65–99)
Potassium: 4.4 mmol/L (ref 3.5–5.3)
Sodium: 140 mmol/L (ref 135–146)
Total Bilirubin: 0.9 mg/dL (ref 0.2–1.2)
Total Protein: 7.4 g/dL (ref 6.1–8.1)
eGFR: 74 mL/min/{1.73_m2} (ref >=60)

## 2022-03-29 LAB — MICROALBUMIN / CREATININE URINE RATIO
Creatinine, Urine: 437 mg/dL — ABNORMAL HIGH (ref 20–320)
Microalb Creat Ratio: 3 mcg/mg creat (ref <30)
Microalb, Ur: 1.2 mg/dL

## 2022-03-29 LAB — PHOSPHORUS: Phosphorus: 3.7 mg/dL (ref 2.5–4.5)

## 2022-03-29 LAB — VITAMIN D 25 HYDROXY (VIT D DEFICIENCY, FRACTURES): Vit D, 25-Hydroxy: 7 ng/mL — ABNORMAL LOW (ref 30–100)

## 2022-03-29 LAB — MAGNESIUM: Magnesium: 2.1 mg/dL (ref 1.5–2.5)

## 2022-03-31 NOTE — Progress Notes (Signed)
Very pleased  with these labs.   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 recommmend avoiding alcohol for next few month because its hard on liver and kidney and dehydrates. Overall, things are looking up.

## 2022-04-02 ENCOUNTER — Other Ambulatory Visit: Payer: Self-pay

## 2022-04-02 DIAGNOSIS — R770 Abnormality of albumin: Secondary | ICD-10-CM

## 2022-04-02 DIAGNOSIS — D649 Anemia, unspecified: Secondary | ICD-10-CM

## 2022-04-02 DIAGNOSIS — N179 Acute kidney failure, unspecified: Secondary | ICD-10-CM

## 2022-04-02 DIAGNOSIS — N183 Chronic kidney disease, stage 3 unspecified: Secondary | ICD-10-CM

## 2022-04-02 DIAGNOSIS — R7989 Other specified abnormal findings of blood chemistry: Secondary | ICD-10-CM

## 2022-04-02 DIAGNOSIS — R319 Hematuria, unspecified: Secondary | ICD-10-CM

## 2022-04-04 ENCOUNTER — Ambulatory Visit (INDEPENDENT_AMBULATORY_CARE_PROVIDER_SITE_OTHER): Payer: Medicaid Other | Admitting: Internal Medicine

## 2022-04-04 ENCOUNTER — Encounter: Payer: Self-pay | Admitting: Internal Medicine

## 2022-04-04 VITALS — BP 101/68 | HR 98 | Temp 98.4°F | Ht 62.0 in | Wt 156.6 lb

## 2022-04-04 DIAGNOSIS — K047 Periapical abscess without sinus: Secondary | ICD-10-CM | POA: Diagnosis not present

## 2022-04-04 MED ORDER — ORAJEL 3X TOOTHACHE & GUM 20-0.26-0.15 % MT GEL: 1.0000 "application " | Freq: Three times a day (TID) | OROMUCOSAL | 11 refills | Status: AC

## 2022-04-04 MED ORDER — AMOXICILLIN-POT CLAVULANATE 875-125 MG PO TABS: 1.0000 | ORAL_TABLET | Freq: Two times a day (BID) | ORAL | 0 refills | Status: AC

## 2022-04-04 NOTE — Progress Notes (Signed)
[name]: [phone]   Routine Medical Office Visit  Patient:  Garrett Cook      Age: 22 y.o.       Sex:  male  Date:   04/04/2022  PCP:    Loralee Pacas, MD   [facility] Provider: Loralee Pacas, MD   Problem Focused Charting:   Medical Decision Making per Assessment/Plan   Ricardo was seen today for wisdom tooth pain.  Dental infection -     Amoxicillin-Pot Clavulanate; Take 1 tablet by mouth 2 (two) times daily.  Dispense: 20 tablet; Refill: 0 -     Ambulatory referral to Oral Maxillofacial Surgery -     Orajel 3X Toothache & Gum; Use as directed 1 application  in the mouth or throat in the morning, at noon, and at bedtime.  Dispense: 11.9 g; Refill: 11   No results found for: "GFR"  Lab Results  Component Value Date/Time   CREATININE 1.37 (H) 03/28/2022 11:41 AM   CREATININE 2.05 (H) 01/31/2022 07:15 AM   CREATININE 2.11 (H) 01/30/2022 05:27 PM   CREATININE 2.27 (H) 01/30/2022 04:14 AM   CREATININE 0.73 01/30/2022 03:22 AM   CREATININE 2.57 (H) 01/29/2022 11:35 PM   CREATININE 0.92 11/02/2014 12:20 PM   Avoid NSAIDs for now due to bad kidneys- patient advised I did ok 400 mg q 8 hours ibuprofen for 3-5 days, stop as soon as possible regarding:pain Use orajel as much as possible   instead of NSAID. Drink fluids with NSAIDs    Take antibiotic(s) call if problems with it, plan for molar extraction with oral surgery most likely     Subjective - Clinical Presentation:   Garrett Cook is a 22 y.o. 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   ondansetron (ZOFRAN-ODT) 4 MG disintegrating tablet Take 1 tablet (4 mg total) by mouth every 8 (eight) hours as needed for nausea or vomiting (for nausea from wegovy or other source).   No facility-administered medications prior to visit.    Chief Complaint  Patient presents with   Wisdom tooth pain    Bottom left, for 2 days.    HPI   Has recurrent pain left lower molar area Called dentist who sent here. He doesn't recall it ever breaking, seems like it never grew in normally but he can't say Pdaos         Objective:  Physical Exam  BP 101/68 (BP Location: Left Arm, Patient Position: Sitting)   Pulse 98   Temp 98.4 F (36.9 C) (Temporal)   Ht 5\' 2"  (1.575 m)   Wt 156 lb 9.6 oz (71 kg)   SpO2 98%  BMI 28.64 kg/m  Overweight  by BMI criteria but truncal adiposity (waist circumference or caliper) should be used instead. He is muscular and healthy weight Wt Readings from Last 10 Encounters:  04/04/22 156 lb 9.6 oz (71 kg)  03/28/22 157 lb 6.4 oz (71.4 kg)  03/08/22 155 lb 9.6 oz (70.6 kg)  01/30/22 150 lb (68 kg)  05/27/19 160 lb (72.6 kg)  01/13/19 160 lb (72.6 kg)  08/21/16 157 lb (71.2 kg) (65 %, Z= 0.39)*  10/04/15 156 lb 6.4 oz (70.9 kg) (72 %, Z= 0.57)*  05/06/15 149 lb 11.2 oz (67.9 kg) (67 %, Z= 0.45)*  11/22/14 150 lb 9.2 oz (68.3 kg) (74 %, Z= 0.63)*   * Growth percentiles are based on CDC (Boys, 2-20 Years) data.   Vital signs reviewed.  Nursing notes reviewed. Weight trend reviewed. General Appearance:  Well developed, well nourished male in no acute distress.   Normal work of breathing at  rest Musculoskeletal: All extremities are intact.  Neurological:  Awake, alert,  No obvious focal neurological deficits or cognitive impairments Psychiatric:  Appropriate mood, pleasant demeanor Problem-specific findings:  left lower molar tender with swollen tissue around it and it seems like it either is broken or never fully grew in       Signed: Loralee Pacas, MD 04/04/2022 4:57 PM

## 2022-05-09 ENCOUNTER — Encounter: Payer: Self-pay | Admitting: Internal Medicine

## 2022-05-09 ENCOUNTER — Ambulatory Visit: Payer: 59 | Admitting: Internal Medicine

## 2022-05-09 VITALS — BP 124/78 | HR 80 | Temp 99.4°F | Ht 62.0 in | Wt 156.0 lb

## 2022-05-09 DIAGNOSIS — D649 Anemia, unspecified: Secondary | ICD-10-CM

## 2022-05-09 DIAGNOSIS — R7303 Prediabetes: Secondary | ICD-10-CM | POA: Diagnosis not present

## 2022-05-09 DIAGNOSIS — R770 Abnormality of albumin: Secondary | ICD-10-CM

## 2022-05-09 DIAGNOSIS — N179 Acute kidney failure, unspecified: Secondary | ICD-10-CM

## 2022-05-09 DIAGNOSIS — A599 Trichomoniasis, unspecified: Secondary | ICD-10-CM

## 2022-05-09 DIAGNOSIS — Z113 Encounter for screening for infections with a predominantly sexual mode of transmission: Secondary | ICD-10-CM

## 2022-05-09 NOTE — Progress Notes (Signed)
[name]: [id number]   Routine Medical Office Visit  Patient:  Garrett Cook      Age: 22 y.o.       Sex:  male  Date:   05/09/2022  PCP:    Loralee Pacas, MD   [place]andan Provider: Loralee Pacas, MD   Problem Focused Charting:   Medical Decision Making per Assessment/Plan   He mainly comes today for sexual transmitted infection screening to assure new partner- but happen to be planning follow up checks on his kidney problem soon anyway so we knock that out same time.  Garrett Cook was seen today for std screening.  Screen for STD (sexually transmitted disease) -     RPR -     HIV Antibody (routine testing w rflx) -     HSV(herpes simplex vrs) 1+2 ab-IgG -     Chlamydia/Gonococcus/Trichomonas, NAA  Acute kidney injury (Garrett Cook) -     Cystatin C with Glomerular Filtration Rate, Estimated (eGFR) -     Microalbumin / creatinine urine ratio -     Urinalysis, Routine w reflex microscopic -     Comprehensive metabolic panel  Prediabetes Overview: Lab Results  Component Value Date   HGBA1C 5.8 (H) 11/02/2014      Low serum albumin Overview: Lab Results  Component Value Date   LABPROT 14.5 01/30/2022     Orders: -     Comprehensive metabolic panel  Anemia, unspecified type -     Comprehensive metabolic panel -     CBC with Differential/Platelet -     Ferritin        Subjective - Clinical Presentation:   Garrett Cook is a 22 y.o. male  Patient Active Problem List   Diagnosis Date Noted   Nausea 03/28/2022   Prediabetes 03/28/2022   Stage 3 chronic kidney disease ([name]) 03/28/2022   Anemia 03/08/2022   Low serum albumin 03/08/2022   AKI (acute kidney injury) ([name]) 01/30/2022   History of colitis 01/30/2022   Eczema 02/22/2016   Seasonal allergic rhinitis 11/02/2014   Failed vision screen, vision 20/20 with glasses but cannot wear glasses due to fogging for football, vision without glasses is 20/70 11/02/2014   Acne 11/02/2014    Past Medical History:  Diagnosis Date   Acne    Allergy    Eczema    Hematuria 03/08/2022   Leg fracture     Outpatient Medications Prior to Visit  Medication Sig   amoxicillin-clavulanate (AUGMENTIN) 875-125 MG tablet Take 1 tablet by mouth 2 (two) times daily.   Benzocaine-Menthol-Zinc Cl (ORAJEL 3X TOOTHACHE & GUM) 20-0.26-0.15 % GEL Use as directed 1 application  in the mouth or throat in the morning, at noon, and at bedtime.   empagliflozin (JARDIANCE) 10 MG TABS tablet Take 1 tablet (10 mg total) by mouth daily before breakfast.   ondansetron (ZOFRAN-ODT) 4 MG disintegrating tablet Take 1 tablet (4 mg total) by mouth every 8 (eight) hours as needed for nausea or vomiting (for nausea from wegovy or other source).   No facility-administered medications prior to visit.    Chief Complaint  Patient presents with   STD screening    HPI   He mainly comes today for sexual transmitted infection screening to assure new partner.          Objective:  Physical Exam  BP 124/78 (BP Location: Left Arm, Patient Position: Sitting)   Pulse 80   Temp 99.4 F (37.4 C) (Temporal)   Ht  $5' 2"u$  (1.575 m)   Wt 156 lb (70.8 kg)   SpO2 98%   BMI 28.53 kg/m   Overweight  by BMI criteria but truncal adiposity (waist circumference or caliper) should be used instead. He is healthy weight, handsome young man. Wt Readings from Last 10 Encounters:  05/09/22 156 lb (70.8 kg)  04/04/22 156 lb 9.6 oz (71 kg)  03/28/22 157 lb 6.4 oz (71.4 kg)  03/08/22 155 lb 9.6 oz (70.6 kg)  01/30/22 150 lb (68 kg)  05/27/19 160 lb (72.6 kg)  01/13/19 160 lb (72.6 kg)  08/21/16 157 lb (71.2 kg) (65 %, Z= 0.39)*  10/04/15 156 lb 6.4 oz (70.9 kg) (72 %, Z= 0.57)*  05/06/15 149 lb 11.2 oz (67.9 kg) (67 %, Z= 0.45)*   * Growth percentiles are based on CDC (Boys, 2-20 Years) data.   Vital signs reviewed.  Nursing notes reviewed. Weight trend reviewed. General Appearance:  Well developed, well nourished male in  no acute distress.   Normal work of breathing at rest Musculoskeletal: All extremities are intact.  Neurological:  Awake, alert,  No obvious focal neurological deficits or cognitive impairments Psychiatric:  Appropriate mood, pleasant demeanor Problem-specific findings:  no genital exam performed, no symptom(s) to evaluate- just screening for new partner.   Results Reviewed: No results found for any visits on 05/09/22.  Recent Results (from the past 2160 hour(s))  UA/M w/rflx Culture, Comp     Status: None   Collection Time: 03/21/22 11:23 AM   Specimen: Urine   Urine  Result Value Ref Range   Specific Gravity, UA CANCELED     Comment: Test not performed. A Boric acid preserved urine is not suitable for urinalysis.  Result canceled by the ancillary.    pH, UA CANCELED     Comment: Test not performed  Result canceled by the ancillary.    Protein,UA CANCELED     Comment: Test not performed  Result canceled by the ancillary.    Glucose, UA CANCELED     Comment: Test not performed  Result canceled by the ancillary.    Ketones, UA CANCELED     Comment: Test not performed  Result canceled by the ancillary.   Urinalysis, Routine w reflex microscopic     Status: Abnormal   Collection Time: 03/21/22 11:23 AM  Result Value Ref Range   Color, Urine YELLOW Yellow;Lt. Yellow;Straw;Dark Yellow;Amber;Green;Red;Brown   APPearance CLEAR Clear;Turbid;Slightly Cloudy;Cloudy   Specific Gravity, Urine >=1.030 (A) 1.000 - 1.030   pH 6.0 5.0 - 8.0   Total Protein, Urine NEGATIVE Negative   Urine Glucose NEGATIVE Negative   Ketones, ur NEGATIVE Negative   Bilirubin Urine SMALL (A) Negative   Hgb urine dipstick NEGATIVE Negative   Urobilinogen, UA 0.2 0.0 - 1.0   Leukocytes,Ua NEGATIVE Negative   Nitrite NEGATIVE Negative   WBC, UA 0-2/hpf 0-2/hpf   RBC / HPF 0-2/hpf 0-2/hpf   Mucus, UA Presence of (A) None   Squamous Epithelial / HPF Rare(0-4/hpf) Rare(0-4/hpf)  Lipid panel      Status: Abnormal   Collection Time: 03/21/22 11:23 AM  Result Value Ref Range   Cholesterol 173 0 - 200 mg/dL    Comment: ATP III Classification       Desirable:  < 200 mg/dL               Borderline High:  200 - 239 mg/dL          High:  > = 240 mg/dL  Triglycerides 145.0 0.0 - 149.0 mg/dL    Comment: Normal:  <150 mg/dLBorderline High:  150 - 199 mg/dL   HDL 39.50 >39.00 mg/dL   VLDL 29.0 0.0 - 40.0 mg/dL   LDL Cholesterol 104 (H) 0 - 99 mg/dL   Total CHOL/HDL Ratio 4     Comment:                Men          Women1/2 Average Risk     3.4          3.3Average Risk          5.0          4.42X Average Risk          9.6          7.13X Average Risk          15.0          11.0                       NonHDL 133.24     Comment: NOTE:  Non-HDL goal should be 30 mg/dL higher than patient's LDL goal (i.e. LDL goal of < 70 mg/dL, would have non-HDL goal of < 100 mg/dL)  RPR     Status: None   Collection Time: 03/21/22 11:23 AM  Result Value Ref Range   RPR Ser Ql NON-REACTIVE NON-REACTIVE    Comment: . No laboratory evidence of syphilis. If recent exposure is suspected, submit a new sample in 2-4 weeks. .   Hepatitis C Antibody     Status: None   Collection Time: 03/21/22 11:23 AM  Result Value Ref Range   Hepatitis C Ab NON-REACTIVE NON-REACTIVE    Comment: . HCV antibody was non-reactive. There is no laboratory  evidence of HCV infection. . In most cases, no further action is required. However, if recent HCV exposure is suspected, a test for HCV RNA (test code [phone]) is suggested. . For additional information please refer to http://education.[facility].com/faq/FAQ22v1 (This link is being provided for informational/ educational purposes only.) .   HIV antibody (with reflex)     Status: None   Collection Time: 03/21/22 11:23 AM  Result Value Ref Range   HIV 1&2 Ab, 4th Generation NON-REACTIVE NON-REACTIVE    Comment: HIV-1 antigen and HIV-1/HIV-2 antibodies were not detected.  There is no laboratory evidence of HIV infection. Marland Kitchen PLEASE NOTE: This information has been disclosed to you from records whose confidentiality may be protected by state law.  If your state requires such protection, then the state law prohibits you from making any further disclosure of the information without the specific written consent of the person to whom it pertains, or as otherwise permitted by law. A general authorization for the release of medical or other information is NOT sufficient for this purpose. . For additional information please refer to http://education.[facility].com/faq/FAQ106 (This link is being provided for informational/ educational purposes only.) . Marland Kitchen The performance of this assay has not been clinically validated in patients less than 55 years old. .   Cytology (oral, anal, urethral) ancillary only     Status: None   Collection Time: 03/21/22 11:23 AM  Result Value Ref Range   Neisseria Gonorrhea Negative    Chlamydia Negative    Trichomonas Negative    Comment Normal Reference Range Trichomonas - Negative    Comment Normal Reference Ranger Chlamydia - Negative    Comment  Normal Reference Range Neisseria Gonorrhea - Negative  VITAMIN D 25 Hydroxy (Vit-D Deficiency, Fractures)     Status: Abnormal   Collection Time: 03/28/22 11:41 AM  Result Value Ref Range   Vit D, 25-Hydroxy 7 (L) 30 - 100 ng/mL    Comment: Vitamin D Status         25-OH Vitamin D: . Deficiency:                    <20 ng/mL Insufficiency:             20 - 29 ng/mL Optimal:                 > or = 30 ng/mL . For 25-OH Vitamin D testing on patients on  D2-supplementation and patients for whom quantitation  of D2 and D3 fractions is required, the QuestAssureD(TM) 25-OH VIT D, (D2,D3), LC/MS/MS is recommended: order  code [phone] (patients >16yr). . See Note 1 . Note 1 . For additional information, please refer to  http://education.[facility].com/faq/FAQ199   (This link is being provided for informational/ educational purposes only.)   Phosphorus     Status: None   Collection Time: 03/28/22 11:41 AM  Result Value Ref Range   Phosphorus 3.7 2.5 - 4.5 mg/dL  Magnesium     Status: None   Collection Time: 03/28/22 11:41 AM  Result Value Ref Range   Magnesium 2.1 1.5 - 2.5 mg/dL  HgB A1c     Status: None   Collection Time: 03/28/22 11:41 AM  Result Value Ref Range   Hgb A1c MFr Bld 5.6 <5.7 % of total Hgb    Comment: For the purpose of screening for the presence of diabetes: . <5.7%       Consistent with the absence of diabetes 5.7-6.4%    Consistent with increased risk for diabetes             (prediabetes) > or =6.5%  Consistent with diabetes . This assay result is consistent with a decreased risk of diabetes. . Currently, no consensus exists regarding use of hemoglobin A1c for diagnosis of diabetes in children. . According to American Diabetes Association (ADA) guidelines, hemoglobin A1c <7.0% represents optimal control in non-pregnant diabetic patients. Different metrics may apply to specific patient populations.  Standards of Medical Care in Diabetes(ADA). .    Mean Plasma Glucose 114 mg/dL   eAG (mmol/L) 6.3 mmol/L    Comment: HbA1c performed on Roche platform. Effective 11/13/21 a change in test platforms may have  shifted HbA1c results compared to historical results.   COMPLETE METABOLIC PANEL WITH GFR     Status: Abnormal   Collection Time: 03/28/22 11:41 AM  Result Value Ref Range   Glucose, Bld 100 (H) 65 - 99 mg/dL    Comment: .            Fasting reference interval . For someone without known diabetes, a glucose value between 100 and 125 mg/dL is consistent with prediabetes and should be confirmed with a follow-up test. .    BUN 16 7 - 25 mg/dL   Creat 1.37 (H) 0.60 - 1.24 mg/dL   eGFR 74 > OR = 60 mL/min/1.766m  BUN/Creatinine Ratio 12 6 - 22 (calc)   Sodium 140 135 - 146 mmol/L   Potassium 4.4 3.5 -  5.3 mmol/L   Chloride 103 98 - 110 mmol/L   CO2 30 20 - 32 mmol/L   Calcium 9.9 8.6 - 10.3 mg/dL  Total Protein 7.4 6.1 - 8.1 g/dL   Albumin 4.8 3.6 - 5.1 g/dL   Globulin 2.6 1.9 - 3.7 g/dL (calc)   AG Ratio 1.8 1.0 - 2.5 (calc)   Total Bilirubin 0.9 0.2 - 1.2 mg/dL   Alkaline phosphatase (APISO) 75 36 - 130 U/L   AST 75 (H) 10 - 40 U/L   ALT 44 9 - 46 U/L  CBC     Status: Abnormal   Collection Time: 03/28/22 11:41 AM  Result Value Ref Range   WBC 5.5 3.8 - 10.8 Thousand/uL   RBC 4.49 4.20 - 5.80 Million/uL   Hemoglobin 12.8 (L) 13.2 - 17.1 g/dL   HCT 36.9 (L) 38.5 - 50.0 %   MCV 82.2 80.0 - 100.0 fL   MCH 28.5 27.0 - 33.0 pg   MCHC 34.7 32.0 - 36.0 g/dL   RDW 13.2 11.0 - 15.0 %   Platelets 358 140 - 400 Thousand/uL   MPV 9.3 7.5 - 12.5 fL  Urinalysis, Complete     Status: Abnormal   Collection Time: 03/28/22 11:42 AM  Result Value Ref Range   Color, Urine YELLOW YELLOW   APPearance TURBID (A) CLEAR   Specific Gravity, Urine 1.036 (H) 1.001 - 1.035   pH 5.5 5.0 - 8.0   Glucose, UA NEGATIVE NEGATIVE   Bilirubin Urine NEGATIVE NEGATIVE   Ketones, ur NEGATIVE NEGATIVE   Hgb urine dipstick NEGATIVE NEGATIVE   Protein, ur TRACE (A) NEGATIVE   Nitrite NEGATIVE NEGATIVE   Leukocytes,Ua NEGATIVE NEGATIVE   WBC, UA NONE SEEN 0 - 5 /HPF   RBC / HPF NONE SEEN 0 - 2 /HPF   Squamous Epithelial / HPF NONE SEEN < OR = 5 /HPF   Bacteria, UA NONE SEEN NONE SEEN /HPF   Hyaline Cast NONE SEEN NONE SEEN /LPF   Note      Comment: This urine was analyzed for the presence of WBC,  RBC, bacteria, casts, and other formed elements.  Only those elements seen were reported. Marland Kitchen Leisa Lenz / creatinine urine ratio     Status: Abnormal   Collection Time: 03/28/22 11:42 AM  Result Value Ref Range   Creatinine, Urine 437 (H) 20 - 320 mg/dL    Comment: Verified by repeat analysis. .    Microalb, Ur 1.2 mg/dL    Comment: Reference Range Not established    Microalb Creat Ratio 3 <30  mcg/mg creat    Comment: . The ADA defines abnormalities in albumin excretion as follows: Marland Kitchen Albuminuria Category        Result (mcg/mg creatinine) . Normal to Mildly increased   <30 Moderately increased         30-299  Severely increased           > OR = 300 . The ADA recommends that at least two of three specimens collected within a 3-6 month period be abnormal before considering a patient to be within a diagnostic category.        Signed: Loralee Pacas, MD 05/09/2022 8:51 PM

## 2022-05-10 LAB — MICROALBUMIN / CREATININE URINE RATIO
Creatinine,U: 278.3 mg/dL
Microalb Creat Ratio: 0.3 mg/g (ref 0.0–30.0)
Microalb, Ur: 1 mg/dL (ref 0.0–1.9)

## 2022-05-10 LAB — URINALYSIS, ROUTINE W REFLEX MICROSCOPIC
Bilirubin Urine: NEGATIVE
Hgb urine dipstick: NEGATIVE
Ketones, ur: NEGATIVE
Leukocytes,Ua: NEGATIVE
Nitrite: NEGATIVE
RBC / HPF: NONE SEEN (ref 0–?)
Specific Gravity, Urine: 1.025 (ref 1.000–1.030)
Total Protein, Urine: NEGATIVE
Urine Glucose: NEGATIVE
Urobilinogen, UA: 0.2 (ref 0.0–1.0)
WBC, UA: NONE SEEN (ref 0–?)
pH: 6 (ref 5.0–8.0)

## 2022-05-10 LAB — COMPREHENSIVE METABOLIC PANEL
ALT: 13 U/L (ref 0–53)
AST: 14 U/L (ref 0–37)
Albumin: 4.8 g/dL (ref 3.5–5.2)
Alkaline Phosphatase: 70 U/L (ref 39–117)
BUN: 18 mg/dL (ref 6–23)
CO2: 28 mEq/L (ref 19–32)
Calcium: 10.1 mg/dL (ref 8.4–10.5)
Chloride: 105 mEq/L (ref 96–112)
Creatinine, Ser: 1.41 mg/dL (ref 0.40–1.50)
GFR: 70.26 mL/min (ref >60.00)
Glucose, Bld: 89 mg/dL (ref 70–99)
Potassium: 4.5 mEq/L (ref 3.5–5.1)
Sodium: 140 mEq/L (ref 135–145)
Total Bilirubin: 1.4 mg/dL — ABNORMAL HIGH (ref 0.2–1.2)
Total Protein: 7.2 g/dL (ref 6.0–8.3)

## 2022-05-10 LAB — CBC WITH DIFFERENTIAL/PLATELET
Basophils Absolute: 0 10*3/uL (ref 0.0–0.1)
Basophils Relative: 0.6 % (ref 0.0–3.0)
Eosinophils Absolute: 0.2 10*3/uL (ref 0.0–0.7)
Eosinophils Relative: 4.7 % (ref 0.0–5.0)
HCT: 37.6 % — ABNORMAL LOW (ref 39.0–52.0)
Hemoglobin: 12.8 g/dL — ABNORMAL LOW (ref 13.0–17.0)
Lymphocytes Relative: 44.3 % (ref 12.0–46.0)
Lymphs Abs: 2 10*3/uL (ref 0.7–4.0)
MCHC: 34 g/dL (ref 30.0–36.0)
MCV: 85.5 fl (ref 78.0–100.0)
Monocytes Absolute: 0.3 10*3/uL (ref 0.1–1.0)
Monocytes Relative: 7.3 % (ref 3.0–12.0)
Neutro Abs: 2 10*3/uL (ref 1.4–7.7)
Neutrophils Relative %: 43.1 % (ref 43.0–77.0)
Platelets: 352 10*3/uL (ref 150.0–400.0)
RBC: 4.39 Mil/uL (ref 4.22–5.81)
RDW: 13.7 % (ref 11.5–15.5)
WBC: 4.6 10*3/uL (ref 4.0–10.5)

## 2022-05-10 LAB — FERRITIN: Ferritin: 124.9 ng/mL (ref 22.0–322.0)

## 2022-05-10 LAB — HSV(HERPES SIMPLEX VRS) I + II AB-IGG
HAV 1 IGG,TYPE SPECIFIC AB: 27.2 index — ABNORMAL HIGH
HSV 2 IGG,TYPE SPECIFIC AB: <0.9 index

## 2022-05-10 LAB — RPR: RPR Ser Ql: NONREACTIVE

## 2022-05-10 LAB — CYSTATIN C WITH GLOMERULAR FILTRATION RATE, ESTIMATED (EGFR)
CYSTATIN C: 0.94 mg/L (ref 0.52–1.35)
eGFR: 98 mL/min/{1.73_m2} (ref >=60)

## 2022-05-10 LAB — HIV ANTIBODY (ROUTINE TESTING W REFLEX): HIV 1&2 Ab, 4th Generation: NONREACTIVE

## 2022-05-11 NOTE — Progress Notes (Signed)
I have reviewed your recent results and, in my medical opinion:  You don't have any sexual transmitted infection.  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 This could break out around your mouth or penis and become transmittable but that is rare.  An estimated 3.7 billion people under age 24 (67%) globally have herpes simplex virus type 1 (HSV-1) infection, the main cause of oral herpes.  Where you do not get breakouts.... this is basically a historical problem for you- our test just showed that you have the immune protection from it.  So go forth confidently in the knowledge that a full sexual transmitted infection panel was performed and negative.      If you ever do get an outbreak of cold sores in your mouth, valtrex medication at the earliest onset can help.  Loralee Pacas, MD  05/11/2022 12:23 PM

## 2022-05-13 LAB — CHLAMYDIA/GONOCOCCUS/TRICHOMONAS, NAA
Chlamydia by NAA: NEGATIVE
Gonococcus by NAA: NEGATIVE
Trich vag by NAA: POSITIVE — AB

## 2022-05-13 MED ORDER — METRONIDAZOLE 500 MG PO TABS: 1000.0000 mg | ORAL_TABLET | ORAL | 0 refills | Status: AC

## 2022-05-13 NOTE — Progress Notes (Signed)
Notify patient and submit a reportable diseases report.  I am sending the antibiotic(s).  Order a repeat test for him to complete in a couple weeks to confirm cure.

## 2022-05-13 NOTE — Addendum Note (Signed)
Addended by: Loralee Pacas on: 05/13/2022 06:54 PM   Modules accepted: Orders

## 2022-05-17 ENCOUNTER — Other Ambulatory Visit: Payer: Self-pay

## 2022-05-17 DIAGNOSIS — Z113 Encounter for screening for infections with a predominantly sexual mode of transmission: Secondary | ICD-10-CM

## 2022-06-28 ENCOUNTER — Ambulatory Visit: Payer: Medicaid Other | Admitting: Internal Medicine

## 2022-06-29 ENCOUNTER — Ambulatory Visit: Payer: Medicaid Other | Admitting: Internal Medicine

## 2023-03-18 NOTE — Progress Notes (Deleted)
   Patient ID: Garrett Cook, male    DOB: [birth year], 22 y.o.   MRN: [id number]  No chief complaint on file.           Assessment & Plan:   Subjective:    Outpatient Medications Prior to Visit  Medication Sig Dispense Refill   amoxicillin -clavulanate (AUGMENTIN ) 875-125 MG tablet Take 1 tablet by mouth 2 (two) times daily. 20 tablet 0   Benzocaine-Menthol-Zinc Cl (ORAJEL 3X TOOTHACHE & GUM) 20-0.26-0.15 % GEL Use as directed 1 application  in the mouth or throat in the morning, at noon, and at bedtime. 11.9 g 11   empagliflozin  (JARDIANCE ) 10 MG TABS tablet Take 1 tablet (10 mg total) by mouth daily before breakfast. 90 tablet 3   metroNIDAZOLE  (FLAGYL ) 500 MG tablet Take 2 tablets (1,000 mg total) by mouth as directed for 1 dose. Take both at once 2 tablet 0   ondansetron  (ZOFRAN -ODT) 4 MG disintegrating tablet Take 1 tablet (4 mg total) by mouth every 8 (eight) hours as needed for nausea or vomiting (for nausea from wegovy or other source). 20 tablet 4   No facility-administered medications prior to visit.   Past Medical History:  Diagnosis Date   Acne    Allergy    Eczema    Hematuria 03/08/2022   Leg fracture    No past surgical history on file. Allergies  Allergen Reactions   Banana Other (See Comments)    vomiting   Pollen Extract     Watery Eyes, Nose & Itchy Throat.      Objective:    Physical Exam Vitals and nursing note reviewed.  Constitutional:      General: He is not in acute distress.    Appearance: Normal appearance.  HENT:     Head: Normocephalic.  Cardiovascular:     Rate and Rhythm: Normal rate and regular rhythm.  Pulmonary:     Effort: Pulmonary effort is normal.     Breath sounds: Normal breath sounds.  Musculoskeletal:        General: Normal range of motion.     Cervical back: Normal range of motion.  Skin:    General: Skin is warm and dry.  Neurological:     Mental Status: He is alert and oriented to person, place, and time.  Psychiatric:         Mood and Affect: Mood normal.    There were no vitals taken for this visit. Wt Readings from Last 3 Encounters:  05/09/22 156 lb (70.8 kg)  04/04/22 156 lb 9.6 oz (71 kg)  03/28/22 157 lb 6.4 oz (71.4 kg)       Garrett Krabbe, NP

## 2023-03-19 ENCOUNTER — Ambulatory Visit: Payer: Medicaid Other | Admitting: Family

## 2023-03-22 ENCOUNTER — Ambulatory Visit: Payer: Medicaid Other | Admitting: Family

## 2023-03-22 VITALS — BP 128/84 | HR 90 | Temp 97.2°F | Ht 62.0 in | Wt 164.4 lb

## 2023-03-22 DIAGNOSIS — R21 Rash and other nonspecific skin eruption: Secondary | ICD-10-CM | POA: Diagnosis not present

## 2023-03-22 MED ORDER — TRIAMCINOLONE ACETONIDE 0.5 % EX OINT: 1.0000 | TOPICAL_OINTMENT | Freq: Two times a day (BID) | CUTANEOUS | 1 refills | Status: AC

## 2023-03-22 NOTE — Progress Notes (Signed)
 Patient ID: Garrett Cook, Garrett Cook    DOB: [birth year], 23 y.o.   MRN: [id number]  Chief Complaint  Patient presents with   Pruritis    Pt c/o of itching underarms, for 2 weeks        Discussed the use of AI scribe software for clinical note transcription with the patient, who gave verbal consent to proceed.  History of Present Illness   The patient presents with an itchy rash under both arms, worse on the right side. The rash is always present but only bothers him at night. The patient denies any similar past episodes. The rash is not located in the axilla, but rather on the outer aspect of the underarms. But he reports having tender lumps in his axillae in past, but they usually go away on their own. The patient has tried changing deodorant, (using Native organic deodorant)  Dove sensitive skin soap & hypoallergenic detergent  but without relief. He has been using Aveeno eczema lotion and Benadryl cream, which provide minimal relief. The patient showers twice daily because he sweats a lot, and uses sensitive skin laundry detergent. He has not seen a dermatologist for this issue yet.     Assessment & Plan:   Contact Dermatitis/Hyperhidrosis - Persistent rash under the arms, worse on the right, with nocturnal pruritus. Pinpoint bumps noted on lateral edges of axilla, around to back of shoulder and down posterior right arm. Same rash noted on  edges of left axilla but not extending to shoulder, just a few down posterior arm.  -Prescribing topical steroid (triamcinolone  0.5% ointment), apply twice daily for a week. -Advised to use hypoallergenic body wash and laundry detergent. -Shower just once daily, and try not to use hot water. -Consider use of prescription strength antiperspirant (Dri) once rash resolves to control sweat and can use on outer arm as well. -Consider DERM referral if rash does not resolve or returns quickly.  Axillary Lumps - Painful bumps in the axilla, possibly related to  blocked/overactive sweat glands. -Recommend use of antibacterial soap (Hibiclens - [employer] or [employer]) with a washcloth or loofah sponge to exfoliate the area.  -Consider DERM referral if persistent sx.    Subjective:    Outpatient Medications Prior to Visit  Medication Sig Dispense Refill   amoxicillin -clavulanate (AUGMENTIN ) 875-125 MG tablet Take 1 tablet by mouth 2 (two) times daily. (Patient not taking: Reported on 03/22/2023) 20 tablet 0   Benzocaine-Menthol-Zinc Cl (ORAJEL 3X TOOTHACHE & GUM) 20-0.26-0.15 % GEL Use as directed 1 application  in the mouth or throat in the morning, at noon, and at bedtime. (Patient not taking: Reported on 03/22/2023) 11.9 g 11   empagliflozin  (JARDIANCE ) 10 MG TABS tablet Take 1 tablet (10 mg total) by mouth daily before breakfast. (Patient not taking: Reported on 03/22/2023) 90 tablet 3   metroNIDAZOLE  (FLAGYL ) 500 MG tablet Take 2 tablets (1,000 mg total) by mouth as directed for 1 dose. Take both at once (Patient not taking: Reported on 03/22/2023) 2 tablet 0   ondansetron  (ZOFRAN -ODT) 4 MG disintegrating tablet Take 1 tablet (4 mg total) by mouth every 8 (eight) hours as needed for nausea or vomiting (for nausea from wegovy or other source). (Patient not taking: Reported on 03/22/2023) 20 tablet 4   No facility-administered medications prior to visit.   Past Medical History:  Diagnosis Date   Acne    Allergy    Eczema    Hematuria 03/08/2022   Leg fracture    No past surgical  history on file. Allergies  Allergen Reactions   Banana Other (See Comments)    vomiting   Pollen Extract     Watery Eyes, Nose & Itchy Throat.      Objective:    Physical Exam Vitals and nursing note reviewed.  Constitutional:      General: He is not in acute distress.    Appearance: Normal appearance.  HENT:     Head: Normocephalic.  Cardiovascular:     Rate and Rhythm: Normal rate and regular rhythm.  Pulmonary:     Effort: Pulmonary effort is normal.     Breath  sounds: Normal breath sounds.    Musculoskeletal:        General: Normal range of motion.     Cervical back: Normal range of motion.  Skin:    General: Skin is warm and dry.     Findings: Rash (Pinpoint bumps noted on lateral edges of axilla with erythema, around to back of shoulder and down posterior right arm (but no erythema), same sx on left axilla, but milder, just a few bumps on posterior arm) present. Rash is urticarial.  Neurological:     Mental Status: He is alert and oriented to person, place, and time.  Psychiatric:        Mood and Affect: Mood normal.    BP 128/84   Pulse 90   Temp (!) 97.2 F (36.2 C)   Ht 5' 2 (1.575 m)   Wt 164 lb 6.4 oz (74.6 kg)   SpO2 98%   BMI 30.07 kg/m  Wt Readings from Last 3 Encounters:  03/22/23 164 lb 6.4 oz (74.6 kg)  05/09/22 156 lb (70.8 kg)  04/04/22 156 lb 9.6 oz (71 kg)      Garrett Krabbe, NP

## 2023-07-19 ENCOUNTER — Ambulatory Visit: Admitting: Internal Medicine

## 2024-01-12 ENCOUNTER — Other Ambulatory Visit: Payer: Self-pay

## 2024-01-12 ENCOUNTER — Encounter (HOSPITAL_BASED_OUTPATIENT_CLINIC_OR_DEPARTMENT_OTHER): Payer: Self-pay

## 2024-01-12 ENCOUNTER — Emergency Department (HOSPITAL_BASED_OUTPATIENT_CLINIC_OR_DEPARTMENT_OTHER)
Admission: EM | Admit: 2024-01-12 | Discharge: 2024-01-12 | Disposition: A | Discharge status: Home / Self Care | Attending: Emergency Medicine | Admitting: Emergency Medicine

## 2024-01-12 ENCOUNTER — Emergency Department (HOSPITAL_BASED_OUTPATIENT_CLINIC_OR_DEPARTMENT_OTHER)

## 2024-01-12 DIAGNOSIS — Y9302 Activity, running: Secondary | ICD-10-CM | POA: Insufficient documentation

## 2024-01-12 DIAGNOSIS — Z23 Encounter for immunization: Secondary | ICD-10-CM | POA: Diagnosis not present

## 2024-01-12 DIAGNOSIS — S0181XA Laceration without foreign body of other part of head, initial encounter: Secondary | ICD-10-CM | POA: Diagnosis not present

## 2024-01-12 DIAGNOSIS — W010XXA Fall on same level from slipping, tripping and stumbling without subsequent striking against object, initial encounter: Secondary | ICD-10-CM | POA: Diagnosis not present

## 2024-01-12 DIAGNOSIS — M799 Soft tissue disorder, unspecified: Secondary | ICD-10-CM | POA: Diagnosis not present

## 2024-01-12 DIAGNOSIS — S0993XA Unspecified injury of face, initial encounter: Secondary | ICD-10-CM | POA: Diagnosis not present

## 2024-01-12 DIAGNOSIS — S0990XA Unspecified injury of head, initial encounter: Secondary | ICD-10-CM | POA: Diagnosis not present

## 2024-01-12 MED ORDER — IBUPROFEN 800 MG PO TABS
800.0000 mg | ORAL_TABLET | Freq: Once | ORAL | Status: AC
  Administered 2024-01-12: 800 mg via ORAL
  Filled 2024-01-12: qty 1

## 2024-01-12 MED ORDER — LIDOCAINE-EPINEPHRINE-TETRACAINE (LET) TOPICAL GEL
3.0000 mL | Freq: Once | TOPICAL | Status: AC
  Administered 2024-01-12: 3 mL via TOPICAL
  Filled 2024-01-12: qty 3

## 2024-01-12 MED ORDER — LIDOCAINE-EPINEPHRINE (PF) 2 %-1:200000 IJ SOLN
10.0000 mL | Freq: Once | INTRAMUSCULAR | Status: AC
  Administered 2024-01-12: 10 mL
  Filled 2024-01-12: qty 20

## 2024-01-12 MED ORDER — TETANUS-DIPHTH-ACELL PERTUSSIS 5-2-15.5 LF-MCG/0.5 IM SUSP
0.5000 mL | Freq: Once | INTRAMUSCULAR | Status: AC
  Administered 2024-01-12: 0.5 mL via INTRAMUSCULAR
  Filled 2024-01-12: qty 0.5

## 2024-01-12 NOTE — Discharge Instructions (Addendum)
 Sutured repair Keep the laceration site dry for the next 24 hours and leave the dressing in place. After 24 hours you may remove the dressing and gently clean the laceration site with antibacterial soap and warm water. Do not scrub the area. Do not soak the area and water for long periods of time. Don't use hydrogen peroxide, iodine-based solutions, or alcohol, which can slow healing, and will probably be painful! Apply topical bacitracin 1-2 times per day for the next 3-5 days. Return to the emergency department (or PCP) in 7 to 8 days for removal of the sutures.  You should return sooner for any signs of infection which would include increased redness around the wound, increased swelling, new drainage of yellow pus.

## 2024-01-12 NOTE — ED Triage Notes (Signed)
 Pt reports he was running, tripped and fell. Pt has laceration to chin. Pt denies any LOC. Pt endorses dizziness.

## 2024-01-12 NOTE — ED Provider Notes (Signed)
 [place] EMERGENCY DEPARTMENT AT [facility] Provider Note   CSN: [id number] Arrival date & time: 01/12/24  1826     Patient presents with: Fall and Laceration  HPI Garrett Cook is a 23 y.o. male presenting for a fall and chin laceration.  Occurred a couple hours ago.  Patient states he was running and tripped and fell landed on his chin.  He denies LOC.  Endorsing a laceration to the right lower chin.  He does also report some tenderness in the left posterior jaw.    Fall  Laceration      Prior to Admission medications   Medication Sig Start Date End Date Taking? Authorizing Provider  amoxicillin -clavulanate (AUGMENTIN ) 875-125 MG tablet Take 1 tablet by mouth 2 (two) times daily. Patient not taking: Reported on 03/22/2023 04/04/22   Jesus Bernardino MATSU, MD  Benzocaine-Menthol-Zinc Cl (ORAJEL 3X TOOTHACHE & GUM) 20-0.26-0.15 % GEL Use as directed 1 application  in the mouth or throat in the morning, at noon, and at bedtime. Patient not taking: Reported on 03/22/2023 04/04/22   Jesus Bernardino MATSU, MD  empagliflozin  (JARDIANCE ) 10 MG TABS tablet Take 1 tablet (10 mg total) by mouth daily before breakfast. Patient not taking: Reported on 03/22/2023 03/28/22   Jesus Bernardino MATSU, MD  metroNIDAZOLE  (FLAGYL ) 500 MG tablet Take 2 tablets (1,000 mg total) by mouth as directed for 1 dose. Take both at once Patient not taking: Reported on 03/22/2023 05/13/22   Jesus Bernardino MATSU, MD  ondansetron  (ZOFRAN -ODT) 4 MG disintegrating tablet Take 1 tablet (4 mg total) by mouth every 8 (eight) hours as needed for nausea or vomiting (for nausea from wegovy or other source). Patient not taking: Reported on 03/22/2023 03/28/22   Jesus Bernardino MATSU, MD  triamcinolone  ointment (KENALOG ) 0.5 % Apply 1 Application topically 2 (two) times daily. 03/22/23   Lucius Krabbe, NP    Allergies: Banana and Pollen extract    Review of Systems See HPI   Physical Exam   Vitals:   01/12/24 1832  BP: 127/79  Pulse:  (!) 102  Resp: 16  Temp: 98.1 F (36.7 C)  SpO2: 99%    CONSTITUTIONAL:  well-appearing, NAD NEURO:  Alert and oriented x 3, CN 3-12 grossly intact Head: Right sided chin laceration approximately 0.75 inches in length.  Not through and through.  Not actively bleeding.  No Battle sign, raccoon eyes or rhinorrhea EYES:  eyes equal and reactive ENT/NECK:  Supple, no stridor  CARDIO: , appears well-perfused  PULM:  No respiratory distress GI/GU:  non-distended MSK/SPINE:  No gross deformities, no edema, moves all extremities  SKIN:  no rash, atraumatic  *Additional and/or pertinent findings included in MDM below  (all labs ordered are listed, but only abnormal results are displayed) Labs Reviewed - No data to display  EKG: None  Radiology: CT Maxillofacial WO CM Result Date: 01/12/2024 CLINICAL DATA:  Blunt facial trauma. Trip and fall while running with laceration to chin. EXAM: CT MAXILLOFACIAL WITHOUT CONTRAST TECHNIQUE: Multidetector CT imaging of the maxillofacial structures was performed. Multiplanar CT image reconstructions were also generated. RADIATION DOSE REDUCTION: This exam was performed according to the departmental dose-optimization program which includes automated exposure control, adjustment of the mA and/or kV according to patient size and/or use of iterative reconstruction technique. COMPARISON:  None Available. FINDINGS: Osseous: No acute fracture of the nasal bone, zygomatic arches, or mandibles. The temporomandibular joints are congruent. The teeth appear intact. Nasal septum is midline. Orbits: No orbital fracture or globe  injury. Sinuses: No sinus fracture or hemosinus. Lobulated mucosal thickening involving the paranasal sinuses greatest in the left maxillary sinus. No mastoid effusion. Soft tissues: Mild soft tissue thickening about the right aspect of the chin. No radiopaque foreign body. Limited intracranial: Assessed on concurrent head CT, reported separately.  IMPRESSION: 1. No acute facial bone fracture. 2. Mild soft tissue thickening about the right aspect of the chin. Electronically Signed   By: Andrea Gasman M.D.   On: 01/12/2024 19:49   CT Head Wo Contrast Result Date: 01/12/2024 CLINICAL DATA:  Blunt trauma, trip and fall while running. Chin laceration. EXAM: CT HEAD WITHOUT CONTRAST TECHNIQUE: Contiguous axial images were obtained from the base of the skull through the vertex without intravenous contrast. RADIATION DOSE REDUCTION: This exam was performed according to the departmental dose-optimization program which includes automated exposure control, adjustment of the mA and/or kV according to patient size and/or use of iterative reconstruction technique. COMPARISON:  None Available. FINDINGS: Brain: No intracranial hemorrhage, mass effect, or midline shift. No hydrocephalus. The basilar cisterns are patent. No evidence of territorial infarct or acute ischemia. No extra-axial or intracranial fluid collection. Vascular: No hyperdense vessel or unexpected calcification. Skull: No fracture or focal lesion. Sinuses/Orbits: Assessed on concurrent face CT, reported separately. Other: None IMPRESSION: No acute intracranial abnormality. No skull fracture. Electronically Signed   By: Andrea Gasman M.D.   On: 01/12/2024 19:41     .Laceration Repair  Date/Time: 01/12/2024 8:42 PM  Performed by: Lang Norleen POUR, PA-C Authorized by: Lang Norleen POUR, PA-C   Consent:    Consent obtained:  Verbal   Consent given by:  Patient   Risks discussed:  Infection, pain, poor cosmetic result and need for additional repair   Alternatives discussed:  No treatment Universal protocol:    Procedure explained and questions answered to patient or proxy's satisfaction: yes     Relevant documents present and verified: yes     Test results available: yes     Patient identity confirmed:  Verbally with patient and arm band Anesthesia:    Anesthesia method:  Topical  application and local infiltration   Topical anesthetic:  LET   Local anesthetic:  Lidocaine  1% WITH epi Laceration details:    Location:  Face   Face location:  Chin   Length (cm):  2 Pre-procedure details:    Preparation:  Patient was prepped and draped in usual sterile fashion Exploration:    Limited defect created (wound extended): no     Hemostasis achieved with:  Direct pressure   Imaging obtained comment:  CT   Imaging outcome: foreign body not noted     Wound exploration: wound explored through full range of motion     Wound extent: areolar tissue violated     Contaminated: no   Treatment:    Area cleansed with:  Saline   Amount of cleaning:  Extensive   Irrigation solution:  Sterile saline   Irrigation volume:  1 liter   Irrigation method:  Pressure wash   Debridement:  None   Undermining:  None Skin repair:    Repair method:  Sutures   Suture size:  4-0   Suture material:  Nylon   Suture technique:  Simple interrupted   Number of sutures:  4 Approximation:    Approximation:  Close Repair type:    Repair type:  Intermediate Post-procedure details:    Dressing:  Non-adherent dressing   Procedure completion:  Tolerated well, no immediate complications  Medications Ordered in the ED  ibuprofen  (ADVIL ) tablet 800 mg (800 mg Oral Given 01/12/24 1942)  lidocaine -EPINEPHrine-tetracaine (LET) topical gel (3 mLs Topical Given 01/12/24 1946)  Tdap (ADACEL) injection 0.5 mL (0.5 mLs Intramuscular Given 01/12/24 1944)  lidocaine -EPINEPHrine (XYLOCAINE  W/EPI) 2 %-1:200000 (PF) injection 10 mL (10 mLs Infiltration Given 01/12/24 1948)                                    Medical Decision Making Amount and/or Complexity of Data Reviewed Radiology: ordered.  Risk Prescription drug management.   23 year old well-appearing male presenting for fall and chin laceration.  Exam notable for laceration to the right lower chin.  Had some concern given the left posterior jaw  tenderness for jaw fracture fortunately CT scans were negative for acute injury.  Laceration repair went well without complications.  Discussed appropriate wound care at home which included topical bacitracin.  Applied a nonadherent dressing.  Advised PCP follow-up for wound reevaluation and suture removal.  Discussed return precautions.  Discharged.     Final diagnoses:  Chin laceration, initial encounter    ED Discharge Orders     None          Lang Norleen POUR, PA-C 01/12/24 2047    Mannie Pac T, DO 01/19/24 [phone]

## 2024-01-20 ENCOUNTER — Emergency Department (HOSPITAL_BASED_OUTPATIENT_CLINIC_OR_DEPARTMENT_OTHER)
Admission: EM | Admit: 2024-01-20 | Discharge: 2024-01-20 | Disposition: A | Discharge status: Home / Self Care | Attending: Emergency Medicine | Admitting: Emergency Medicine

## 2024-01-20 ENCOUNTER — Other Ambulatory Visit: Payer: Self-pay

## 2024-01-20 DIAGNOSIS — Z4802 Encounter for removal of sutures: Secondary | ICD-10-CM | POA: Insufficient documentation

## 2024-01-20 NOTE — ED Provider Notes (Signed)
   EMERGENCY DEPARTMENT AT [facility] Provider Note   CSN: [id number] Arrival date & time: 01/20/24  1523     Patient presents with: Suture / Staple Removal   Garrett Cook is a 23 y.o. male.   Patient here for removal of stitches in his chin.  Sutures were placed 8 days ago.  He has no complaint  The history is provided by the patient.  Suture / Staple Removal       Prior to Admission medications   Medication Sig Start Date End Date Taking? Authorizing Provider  amoxicillin -clavulanate (AUGMENTIN ) 875-125 MG tablet Take 1 tablet by mouth 2 (two) times daily. Patient not taking: Reported on 03/22/2023 04/04/22   Jesus Bernardino MATSU, MD  Benzocaine-Menthol-Zinc Cl (ORAJEL 3X TOOTHACHE & GUM) 20-0.26-0.15 % GEL Use as directed 1 application  in the mouth or throat in the morning, at noon, and at bedtime. Patient not taking: Reported on 03/22/2023 04/04/22   Jesus Bernardino MATSU, MD  empagliflozin  (JARDIANCE ) 10 MG TABS tablet Take 1 tablet (10 mg total) by mouth daily before breakfast. Patient not taking: Reported on 03/22/2023 03/28/22   Jesus Bernardino MATSU, MD  metroNIDAZOLE  (FLAGYL ) 500 MG tablet Take 2 tablets (1,000 mg total) by mouth as directed for 1 dose. Take both at once Patient not taking: Reported on 03/22/2023 05/13/22   Jesus Bernardino MATSU, MD  ondansetron  (ZOFRAN -ODT) 4 MG disintegrating tablet Take 1 tablet (4 mg total) by mouth every 8 (eight) hours as needed for nausea or vomiting (for nausea from wegovy or other source). Patient not taking: Reported on 03/22/2023 03/28/22   Jesus Bernardino MATSU, MD  triamcinolone  ointment (KENALOG ) 0.5 % Apply 1 Application topically 2 (two) times daily. 03/22/23   Lucius Krabbe, NP    Allergies: Banana and Pollen extract    Review of Systems  Updated Vital Signs BP 114/68 (BP Location: Right Arm)   Pulse 89   Temp 98.3 F (36.8 C)   Resp 16   SpO2 95%   Physical Exam Vitals and nursing note reviewed.  HENT:     Head:      Comments: Healing wound on the right side of the chin Neurological:     Mental Status: He is alert.     (all labs ordered are listed, but only abnormal results are displayed) Labs Reviewed - No data to display  EKG: None  Radiology: No results found.   Procedures   Medications Ordered in the ED - No data to display                                  Medical Decision Making  3 sutures removed without complication.  Wound is healing.     Final diagnoses:  Visit for suture removal    ED Discharge Orders     None          Doretha Folks, MD 01/20/24 1552

## 2024-01-20 NOTE — Discharge Instructions (Signed)
 Put vasoline on the area
